# Patient Record
Sex: Male | Born: 1945 | Race: Black or African American | Hispanic: No | Marital: Married | Smoking: Former smoker
Health system: Southern US, Community
[De-identification: ages and names within clinical notes are randomized; demographics above are authoritative.]

## PROBLEM LIST (undated history)

## (undated) DIAGNOSIS — I350 Nonrheumatic aortic (valve) stenosis: Secondary | ICD-10-CM

## (undated) DIAGNOSIS — N400 Enlarged prostate without lower urinary tract symptoms: Secondary | ICD-10-CM

## (undated) DIAGNOSIS — I708 Atherosclerosis of other arteries: Secondary | ICD-10-CM

## (undated) DIAGNOSIS — K4020 Bilateral inguinal hernia, without obstruction or gangrene, not specified as recurrent: Secondary | ICD-10-CM

## (undated) DIAGNOSIS — E785 Hyperlipidemia, unspecified: Secondary | ICD-10-CM

## (undated) DIAGNOSIS — R011 Cardiac murmur, unspecified: Secondary | ICD-10-CM

## (undated) DIAGNOSIS — I10 Essential (primary) hypertension: Secondary | ICD-10-CM

## (undated) DIAGNOSIS — F1290 Cannabis use, unspecified, uncomplicated: Secondary | ICD-10-CM

## (undated) HISTORY — PX: COLONOSCOPY: SHX174

---

## 2014-03-16 ENCOUNTER — Ambulatory Visit: Payer: Self-pay | Admitting: Gastroenterology

## 2015-12-19 ENCOUNTER — Ambulatory Visit
Admission: EM | Admit: 2015-12-19 | Discharge: 2015-12-19 | Disposition: A | Discharge status: Home / Self Care | Payer: Medicare (Managed Care) | Attending: Family Medicine | Admitting: Family Medicine

## 2015-12-19 ENCOUNTER — Ambulatory Visit (INDEPENDENT_AMBULATORY_CARE_PROVIDER_SITE_OTHER): Payer: Medicare (Managed Care)

## 2015-12-19 DIAGNOSIS — M7711 Lateral epicondylitis, right elbow: Secondary | ICD-10-CM

## 2015-12-19 DIAGNOSIS — M25521 Pain in right elbow: Secondary | ICD-10-CM | POA: Diagnosis not present

## 2015-12-19 HISTORY — DX: Essential (primary) hypertension: I10

## 2015-12-19 MED ORDER — MELOXICAM 15 MG PO TABS: 15.0000 mg | ORAL_TABLET | Freq: Every day | ORAL | Status: DC

## 2015-12-19 NOTE — Discharge Instructions (Signed)
Recommend getting a CHO at the pharmacy or medical supply place of your choice. Make sure that show strep or a wrist band is tight enough to prevent movement of the elbow tendons over the lateral epicondyles bone. Tennis Elbow Tennis elbow is puffiness (inflammation) of the outer tendons of your forearm close to your elbow. Your tendons attach your muscles to your bones. Tennis elbow can happen in any sport or job in which you use your elbow too much. It is caused by doing the same motion over and over. Tennis elbow can cause:  Pain and tenderness in your forearm and the outer part of your elbow.  A burning feeling. This runs from your elbow through your arm.  Weak grip in your hands. HOME CARE Activity  Rest your elbow and wrist as told by your doctor. Try to avoid any activities that caused the problem until your doctor says that you can do them again.  If a physical therapist teaches you exercises, do all of them as told.  If you lift an object, lift it with your palm facing up. This is easier on your elbow. Lifestyle  If your tennis elbow is caused by sports, check your equipment and make sure that:  You are using it correctly.  It fits you well.  If your tennis elbow is caused by work, take breaks often, if you are able. Talk with your manager about doing your work in a way that is safe for you.  If your tennis elbow is caused by computer use, talk with your manager about any changes that can be made to your work setup. General Instructions  If told, apply ice to the painful area:  Put ice in a plastic bag.  Place a towel between your skin and the bag.  Leave the ice on for 20 minutes, 2-3 times per day.  Take medicines only as told by your doctor.  If you were given a brace, wear it as told by your doctor.  Keep all follow-up visits as told by your doctor. This is important. GET HELP IF:  Your pain does not get better with treatment.  Your pain gets worse.  You  have weakness in your forearm, hand, or fingers.  You cannot feel your forearm, hand, or fingers.   This information is not intended to replace advice given to you by your health care provider. Make sure you discuss any questions you have with your health care provider.   Document Released: 05/17/2010 Document Revised: 04/13/2015 Document Reviewed: 11/23/2014 Elsevier Interactive Patient Education 2016 [employer]  Tendinitis  Tendinitis is redness, soreness, and puffiness (inflammation) of the tendons. Tendons are band-like tissues that connect muscle to bone. Tendinitis often happens in the shoulders, heels, or elbows. It might happen if your job involves doing the same motions over and over. HOME CARE  Use a sling or splint as told by your doctor.  Put ice on the injured area.  Put ice in a plastic bag.  Place a towel between your skin and the bag.  Leave the ice on for 15-20 minutes, 03-04 times a day.  Avoid using your injured arm or leg until the pain goes away.  Do gentle exercises only as told by your doctor. Stop exercises if the pain gets worse, unless your doctor tells you otherwise.  Only take medicines as told by your doctor. GET HELP RIGHT AWAY IF:  Your pain and puffiness get worse.  You have new problems, such as loss  of feeling (numbness) in the hands. MAKE SURE YOU:  Understand these instructions.  Will watch your condition.  Will get help right away if you are not doing well or get worse.   This information is not intended to replace advice given to you by your health care provider. Make sure you discuss any questions you have with your health care provider.   Document Released: 03/09/2011 Document Revised: 02/19/2012 Document Reviewed: 03/09/2011 Elsevier Interactive Patient Education 2016 [employer]  Tendinitis  Tendinitis is redness, soreness, and puffiness (inflammation) of the tendons. Tendons are band-like tissues that connect muscle to  bone. Tendinitis often happens in the shoulders, heels, or elbows. It might happen if your job involves doing the same motions over and over. HOME CARE  Use a sling or splint as told by your doctor.  Put ice on the injured area.  Put ice in a plastic bag.  Place a towel between your skin and the bag.  Leave the ice on for 15-20 minutes, 03-04 times a day.  Avoid using your injured arm or leg until the pain goes away.  Do gentle exercises only as told by your doctor. Stop exercises if the pain gets worse, unless your doctor tells you otherwise.  Only take medicines as told by your doctor. GET HELP RIGHT AWAY IF:  Your pain and puffiness get worse.  You have new problems, such as loss of feeling (numbness) in the hands. MAKE SURE YOU:  Understand these instructions.  Will watch your condition.  Will get help right away if you are not doing well or get worse.   This information is not intended to replace advice given to you by your health care provider. Make sure you discuss any questions you have with your health care provider.   Document Released: 03/09/2011 Document Revised: 02/19/2012 Document Reviewed: 03/09/2011 Elsevier Interactive Patient Education 2016 [employer]  Lateral Epicondylitis With Rehab Lateral epicondylitis involves inflammation and pain around the outer portion of the elbow. The pain is caused by inflammation of the tendons in the forearm that bring back (extend) the wrist. Lateral epicondylitis is also called tennis elbow, because it is very common in tennis players. However, it may occur in any individual who extends the wrist repetitively. If lateral epicondylitis is left untreated, it may become a chronic problem. SYMPTOMS   Pain, tenderness, and inflammation on the outer (lateral) side of the elbow.  Pain or weakness with gripping activities.  Pain that increases with wrist-twisting motions (playing tennis, using a screwdriver, opening a door or  a jar).  Pain with lifting objects, including a coffee cup. CAUSES  Lateral epicondylitis is caused by inflammation of the tendons that extend the wrist. Causes of injury may include:  Repetitive stress and strain on the muscles and tendons that extend the wrist.  Sudden change in activity level or intensity.  Incorrect grip in racquet sports.  Incorrect grip size of racquet (often too large).  Incorrect hitting position or technique (usually backhand, leading with the elbow).  Using a racket that is too heavy. RISK INCREASES WITH:  Sports or occupations that require repetitive and/or strenuous forearm and wrist movements (tennis, squash, racquetball, carpentry).  Poor wrist and forearm strength and flexibility.  Failure to warm up properly before activity.  Resuming activity before healing, rehabilitation, and conditioning are complete. PREVENTION   Warm up and stretch properly before activity.  Maintain physical fitness:  Strength, flexibility, and endurance.  Cardiovascular fitness.  Wear and use properly fitted  equipment.  Learn and use proper technique and have a coach correct improper technique.  Wear a tennis elbow (counterforce) brace. PROGNOSIS  The course of this condition depends on the degree of the injury. If treated properly, acute cases (symptoms lasting less than 4 weeks) are often resolved in 2 to 6 weeks. Chronic (longer lasting cases) often resolve in 3 to 6 months but may require physical therapy. RELATED COMPLICATIONS   Frequently recurring symptoms, resulting in a chronic problem. Properly treating the problem the first time decreases frequency of recurrence.  Chronic inflammation, scarring tendon degeneration, and partial tendon tear, requiring surgery.  Delayed healing or resolution of symptoms. TREATMENT  Treatment first involves the use of ice and medicine to reduce pain and inflammation. Strengthening and stretching exercises may help  reduce discomfort if performed regularly. These exercises may be performed at home if the condition is an acute injury. Chronic cases may require a referral to a physical therapist for evaluation and treatment. Your caregiver may advise a corticosteroid injection to help reduce inflammation. Rarely, surgery is needed. MEDICATION  If pain medicine is needed, nonsteroidal anti-inflammatory medicines (aspirin and ibuprofen), or other minor pain relievers (acetaminophen), are often advised.  Do not take pain medicine for 7 days before surgery.  Prescription pain relievers may be given, if your caregiver thinks they are needed. Use only as directed and only as much as you need.  Corticosteroid injections may be recommended. These injections should be reserved only for the most severe cases, because they can only be given a certain number of times. HEAT AND COLD  Cold treatment (icing) should be applied for 10 to 15 minutes every 2 to 3 hours for inflammation and pain, and immediately after activity that aggravates your symptoms. Use ice packs or an ice massage.  Heat treatment may be used before performing stretching and strengthening activities prescribed by your caregiver, physical therapist, or athletic trainer. Use a heat pack or a warm water soak. SEEK MEDICAL CARE IF: Symptoms get worse or do not improve in 2 weeks, despite treatment. EXERCISES  RANGE OF MOTION (ROM) AND STRETCHING EXERCISES - Epicondylitis, Lateral (Tennis Elbow) These exercises may help you when beginning to rehabilitate your injury. Your symptoms may go away with or without further involvement from your physician, physical therapist, or athletic trainer. While completing these exercises, remember:   Restoring tissue flexibility helps normal motion to return to the joints. This allows healthier, less painful movement and activity.  An effective stretch should be held for at least 30 seconds.  A stretch should never be  painful. You should only feel a gentle lengthening or release in the stretched tissue. RANGE OF MOTION - Wrist Flexion, Active-Assisted  Extend your right / left elbow with your fingers pointing down.*  Gently pull the back of your hand towards you, until you feel a gentle stretch on the top of your forearm.  Hold this position for __________ seconds. Repeat __________ times. Complete this exercise __________ times per day.  *If directed by your physician, physical therapist or athletic trainer, complete this stretch with your elbow bent, rather than extended. RANGE OF MOTION - Wrist Extension, Active-Assisted  Extend your right / left elbow and turn your palm upwards.*  Gently pull your palm and fingertips back, so your wrist extends and your fingers point more toward the ground.  You should feel a gentle stretch on the inside of your forearm.  Hold this position for __________ seconds. Repeat __________ times. Complete this exercise  __________ times per day. *If directed by your physician, physical therapist or athletic trainer, complete this stretch with your elbow bent, rather than extended. STRETCH - Wrist Flexion  Place the back of your right / left hand on a tabletop, leaving your elbow slightly bent. Your fingers should point away from your body.  Gently press the back of your hand down onto the table by straightening your elbow. You should feel a stretch on the top of your forearm.  Hold this position for __________ seconds. Repeat __________ times. Complete this stretch __________ times per day.  STRETCH - Wrist Extension   Place your right / left fingertips on a tabletop, leaving your elbow slightly bent. Your fingers should point backwards.  Gently press your fingers and palm down onto the table by straightening your elbow. You should feel a stretch on the inside of your forearm.  Hold this position for __________ seconds. Repeat __________ times. Complete this stretch  __________ times per day.  STRENGTHENING EXERCISES - Epicondylitis, Lateral (Tennis Elbow) These exercises may help you when beginning to rehabilitate your injury. They may resolve your symptoms with or without further involvement from your physician, physical therapist, or athletic trainer. While completing these exercises, remember:   Muscles can gain both the endurance and the strength needed for everyday activities through controlled exercises.  Complete these exercises as instructed by your physician, physical therapist or athletic trainer. Increase the resistance and repetitions only as guided.  You may experience muscle soreness or fatigue, but the pain or discomfort you are trying to eliminate should never worsen during these exercises. If this pain does get worse, stop and make sure you are following the directions exactly. If the pain is still present after adjustments, discontinue the exercise until you can discuss the trouble with your caregiver. STRENGTH - Wrist Flexors  Sit with your right / left forearm palm-up and fully supported on a table or countertop. Your elbow should be resting below the height of your shoulder. Allow your wrist to extend over the edge of the surface.  Loosely holding a __________ weight, or a piece of rubber exercise band or tubing, slowly curl your hand up toward your forearm.  Hold this position for __________ seconds. Slowly lower the wrist back to the starting position in a controlled manner. Repeat __________ times. Complete this exercise __________ times per day.  STRENGTH - Wrist Extensors  Sit with your right / left forearm palm-down and fully supported on a table or countertop. Your elbow should be resting below the height of your shoulder. Allow your wrist to extend over the edge of the surface.  Loosely holding a __________ weight, or a piece of rubber exercise band or tubing, slowly curl your hand up toward your forearm.  Hold this position  for __________ seconds. Slowly lower the wrist back to the starting position in a controlled manner. Repeat __________ times. Complete this exercise __________ times per day.  STRENGTH - Ulnar Deviators  Stand with a ____________________ weight in your right / left hand, or sit while holding a rubber exercise band or tubing, with your healthy arm supported on a table or countertop.  Move your wrist, so that your pinkie travels toward your forearm and your thumb moves away from your forearm.  Hold this position for __________ seconds and then slowly lower the wrist back to the starting position. Repeat __________ times. Complete this exercise __________ times per day STRENGTH - Radial Deviators  Stand with a ____________________ weight in  your right / left hand, or sit while holding a rubber exercise band or tubing, with your injured arm supported on a table or countertop.  Raise your hand upward in front of you or pull up on the rubber tubing.  Hold this position for __________ seconds and then slowly lower the wrist back to the starting position. Repeat __________ times. Complete this exercise __________ times per day. STRENGTH - Forearm Supinators   Sit with your right / left forearm supported on a table, keeping your elbow below shoulder height. Rest your hand over the edge, palm down.  Gently grip a hammer or a soup ladle.  Without moving your elbow, slowly turn your palm and hand upward to a "thumbs-up" position.  Hold this position for __________ seconds. Slowly return to the starting position. Repeat __________ times. Complete this exercise __________ times per day.  STRENGTH - Forearm Pronators   Sit with your right / left forearm supported on a table, keeping your elbow below shoulder height. Rest your hand over the edge, palm up.  Gently grip a hammer or a soup ladle.  Without moving your elbow, slowly turn your palm and hand upward to a "thumbs-up" position.  Hold this  position for __________ seconds. Slowly return to the starting position. Repeat __________ times. Complete this exercise __________ times per day.  STRENGTH - Grip  Grasp a tennis ball, a dense sponge, or a large, rolled sock in your hand.  Squeeze as hard as you can, without increasing any pain.  Hold this position for __________ seconds. Release your grip slowly. Repeat __________ times. Complete this exercise __________ times per day.  STRENGTH - Elbow Extensors, Isometric  Stand or sit upright, on a firm surface. Place your right / left arm so that your palm faces your stomach, and it is at the height of your waist.  Place your opposite hand on the underside of your forearm. Gently push up as your right / left arm resists. Push as hard as you can with both arms, without causing any pain or movement at your right / left elbow. Hold this stationary position for __________ seconds. Gradually release the tension in both arms. Allow your muscles to relax completely before repeating.   This information is not intended to replace advice given to you by your health care provider. Make sure you discuss any questions you have with your health care provider.   Document Released: 11/27/2005 Document Revised: 12/18/2014 Document Reviewed: 03/11/2009 Elsevier Interactive Patient Education [employer]

## 2015-12-19 NOTE — ED Provider Notes (Addendum)
CSN: [id number]     Arrival date & time 12/19/15  1210 History   First MD Initiated Contact with Patient 12/19/15 1259     Nurses notes were reviewed. Chief Complaint  Patient presents with  . Arm Pain    Right elbow and right lower arm pain x one month. Elbow somewhat tender to touch and burning sensation. Lower arm feels strained. 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 Hurts worse after shoveling snow yesterday. Pain 5/10   Patient reports pain in his right arm about 3 months before October and Halloween. Patient reports he was fine until sometime in October when he started noticing pain in his right elbow. He states the pain has progressively gotten worse. He also noticed that he was out shoveling yesterday in the snow and he has started to have pain in the left elbow as well both of the areas of pain is on the lateral aspect of the elbow at the elbow joint. 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 When he is actually playing the instrument he does not have too much discomfort after playing bass when the throbbing and pain starts. He has mild hypertension states he does not smoke and does not have any history of diabetes.      (Consider location/radiation/quality/duration/timing/severity/associated sxs/prior Treatment) Patient is a 70 y.o. male presenting with arm pain. No language interpreter was used.  Arm Pain This is a new problem. The current episode started more than 1 week ago (About 3 months duration). The problem occurs constantly. The problem has been gradually worsening. Pertinent negatives include no chest pain, no abdominal pain and no headaches. The symptoms are aggravated by exertion. Nothing relieves the symptoms. He has tried nothing for the symptoms.    Past Medical History  Diagnosis Date  . HTN (hypertension)    No past surgical history on file. No family history on file. Social History    Substance Use Topics  . Smoking status: Never Smoker   . Smokeless tobacco: Not on file  . Alcohol Use: Yes     Comment: rare    Review of Systems  Cardiovascular: Negative for chest pain.  Gastrointestinal: Negative for abdominal pain.  Neurological: Negative for headaches.  All other systems reviewed and are negative.   Allergies  Review of patient's allergies indicates no known allergies.  Home Medications   Prior to Admission medications   Medication Sig Start Date End Date Taking? Authorizing Provider  amLODipine (NORVASC) 10 MG tablet Take 10 mg by mouth daily.   Yes Historical Provider, MD   Meds Ordered and Administered this Visit  Medications - No data to display  BP 136/74 mmHg  Pulse 61  Temp(Src) 97.9 F (36.6 C) (Oral)  Resp 18  Ht 5\' 7"  (1.702 m)  Wt 173 lb (78.472 kg)  BMI 27.09 kg/m2  SpO2 100% No data found.   Physical Exam  Constitutional: He appears well-developed and well-nourished.  HENT:  Head: Normocephalic and atraumatic.  Eyes: Conjunctivae are normal. Pupils are equal, round, and reactive to light.  Musculoskeletal: He exhibits tenderness.       Left forearm: He exhibits tenderness and swelling.       Arms: Neurological: He is alert.  Skin: Skin is warm and dry.  Psychiatric: He has a normal mood and affect.  Vitals reviewed.   ED Course  Procedures (including critical care time)  Labs Review Labs  Reviewed - No data to display  Imaging Review Dg Elbow Complete Right  12/19/2015  CLINICAL DATA:  Posterior right elbow pain for many years. No history of trauma. EXAM: RIGHT ELBOW - COMPLETE 3+ VIEW COMPARISON:  None. FINDINGS: There is no evidence of fracture, dislocation, or joint effusion. There is no evidence of arthropathy or other focal bone abnormality. Soft tissues are unremarkable. IMPRESSION: Negative. Electronically Signed   By: Bary Richard M.D.   On: 12/19/2015 13:49     Visual Acuity Review  Right Eye Distance:    Left Eye Distance:   Bilateral Distance:    Right Eye Near:   Left Eye Near:    Bilateral Near:         MDM   1. Right tennis elbow     Patient has right tennis elbow and is starting to develop a left tennis elbow as well. We'll recommend show strap or tight wrist pain to keep the tendon from moving over the lateral epicondyles which is causing the irritation and pain. We'll place on Mobic 15 mg 1 tablet a day. He declined work note for tomorrow.    Hassan Rowan, MD 12/19/15 [id number]  Hassan Rowan, MD 12/19/15 [phone]

## 2016-02-01 ENCOUNTER — Ambulatory Visit
Admission: EM | Admit: 2016-02-01 | Discharge: 2016-02-01 | Disposition: A | Discharge status: Home / Self Care | Payer: Medicare (Managed Care) | Attending: Family Medicine | Admitting: Family Medicine

## 2016-02-01 DIAGNOSIS — M7712 Lateral epicondylitis, left elbow: Secondary | ICD-10-CM

## 2016-02-01 DIAGNOSIS — M7711 Lateral epicondylitis, right elbow: Secondary | ICD-10-CM

## 2016-02-01 MED ORDER — MELOXICAM 15 MG PO TABS: 15.0000 mg | ORAL_TABLET | Freq: Every day | ORAL | Status: DC

## 2016-02-01 NOTE — ED Provider Notes (Signed)
CSN: [id number]     Arrival date & time 02/01/16  4098 History   First MD Initiated Contact with Patient 02/01/16 1219     Chief Complaint  Patient presents with  . Elbow Pain    Right Elbow  . Nasal Congestion   (Consider location/radiation/quality/duration/timing/severity/associated sxs/prior Treatment) HPI   70 year old gentleman who returns today after being seen and evaluated by Dr. Thurmond Butts on 12/19/2015 for bilateral tennis elbows. The patient states that he has improved but has noticed that he has pain at nighttime after being active. 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 He states it does not bother him during the activity but that nighttime especially painful. Meloxicam has definitely helped him. He also had been using a tennis elbow strap but had been positioning it in the wrong position. Is also been having some nasal congestion recently said no fever or chills.  Past Medical History  Diagnosis Date  . HTN (hypertension)    History reviewed. No pertinent past surgical history. History reviewed. No pertinent family history. Social History  Substance Use Topics  . Smoking status: Never Smoker   . Smokeless tobacco: None  . Alcohol Use: Yes     Comment: rare    Review of Systems  Constitutional: Positive for activity change. Negative for fever, chills and fatigue.  Musculoskeletal: Positive for myalgias.  All other systems reviewed and are negative.   Allergies  Review of patient's allergies indicates no known allergies.  Home Medications   Prior to Admission medications   Medication Sig Start Date End Date Taking? Authorizing Provider  amLODipine (NORVASC) 10 MG tablet Take 10 mg by mouth daily.   Yes Historical Provider, MD  meloxicam (MOBIC) 15 MG tablet Take 1 tablet (15 mg total) by mouth daily. 02/01/16   Lutricia Feil, PA-C   Meds Ordered and Administered this Visit  Medications - No data to display  BP 193/91  mmHg  Pulse 67  Temp(Src) 98.2 F (36.8 C) (Oral)  Resp 16  Ht  (1.702 m)  Wt 173 lb (78.472 kg)  BMI 27.09 kg/m2  SpO2 100% No data found.   Physical Exam  Constitutional: He is oriented to person, place, and time. He appears well-developed and well-nourished. No distress.  HENT:  Head: Normocephalic and atraumatic.  Eyes: Conjunctivae are normal. Pupils are equal, round, and reactive to light.  Neck: Normal range of motion. Neck supple.  Musculoskeletal: He exhibits tenderness. He exhibits no edema.  Examination of both elbows shows a full range of motion of flexion and extension pronation and supination. There is minimal pain over the common extensor origin more prevalent on the right than on the left. He is able to lift with his wrist in pronation without elbow pain.  Neurological: He is alert and oriented to person, place, and time.  Skin: Skin is warm and dry. He is not diaphoretic.  Psychiatric: He has a normal mood and affect. His behavior is normal. Judgment and thought content normal.  Nursing note and vitals reviewed.   ED Course  Procedures (including critical care time)  Labs Review Labs Reviewed - No data to display  Imaging Review No results found.   Visual Acuity Review  Right Eye Distance:   Left Eye Distance:   Bilateral Distance:    Right Eye Near:   Left Eye Near:    Bilateral Near:         MDM   1. Bilateral  tennis elbow    Discharge Medication List as of 02/01/2016 12:31 PM     Plan: 1. Diagnosis . reviewed with patient 2. rx as per orders; risks, benefits, potential side effects reviewed with patient 3. Recommend supportive treatment with proper use of the tennis elbow strap . I have cautioned him guarding the use of meloxicam particular at nighttime before going to bed. Recommended that he used with meals only. He also used only as necessary. For the nasal congestion I recommended Flonase which she can get as an OTC drug. He may  want to see tennis strap during his performances as this may be causing him his nighttime discomfort. 4. F/u prn if symptoms worsen or don't improve    Lutricia Feil, PA-C 02/01/16 1244

## 2016-02-01 NOTE — Discharge Instructions (Signed)
Tennis Elbow Tennis elbow (lateral epicondylitis) is inflammation of the outer tendons of your forearm close to your elbow. Your tendons attach your muscles to your bones. The outer tendons of your forearm are used to extend your wrist, and they attach on the outside part of your elbow. Tennis elbow is often found in people who play tennis, but anyone may get the condition from repeatedly extending the wrist or turning the forearm. CAUSES This condition is caused by repeatedly extending your wrist and using your hands. It can result from sports or work that requires repetitive forearm movements. Tennis elbow may also be caused by an injury. RISK FACTORS You have a higher risk of developing tennis elbow if you play tennis or another racquet sport. You also have a higher risk if you frequently use your hands for work. This condition is also more likely to develop in:  Musicians.  Carpenters, painters, and plumbers.  Cooks.  Cashiers.  People who work in factories.  Construction workers.  Butchers.  People who use computers. SYMPTOMS Symptoms of this condition include:  Pain and tenderness in your forearm and the outer part of your elbow. You may only feel the pain when you use your arm, or you may feel it even when you are not using your arm.  A burning feeling that runs from your elbow through your arm.  Weak grip in your hands. DIAGNOSIS  This condition may be diagnosed by medical history and physical exam. You may also have other tests, including:  X-rays.  MRI. TREATMENT Your health care provider will recommend lifestyle adjustments, such as resting and icing your arm. Treatment may also include:  Medicines for inflammation. This may include shots of cortisone if your pain continues.  Physical therapy. This may include massage or exercises.  An elbow brace. Surgery may eventually be recommended if your pain does not go away with treatment. HOME CARE  INSTRUCTIONS Activity  Rest your elbow and wrist as directed by your health care provider. Try to avoid any activities that caused the problem until your health care provider says that you can do them again.  If a physical therapist teaches you exercises, do all of them as directed.  If you lift an object, lift it with your palm facing upward. This lowers the stress on your elbow. Lifestyle  If your tennis elbow is caused by sports, check your equipment and make sure that:  You are using it correctly.  It is the best fit for you.  If your tennis elbow is caused by work, take breaks frequently, if you are able. Talk with your manager about how to best perform tasks in a way that is safe.  If your tennis elbow is caused by computer use, talk with your manager about any changes that can be made to your work environment. General Instructions  If directed, apply ice to the painful area:  Put ice in a plastic bag.  Place a towel between your skin and the bag.  Leave the ice on for 20 minutes, 2-3 times per day.  Take medicines only as directed by your health care provider.  If you were given a brace, wear it as directed by your health care provider.  Keep all follow-up visits as directed by your health care provider. This is important. SEEK MEDICAL CARE IF:  Your pain does not get better with treatment.  Your pain gets worse.  You have numbness or weakness in your forearm, hand, or fingers.     This information is not intended to replace advice given to you by your health care provider. Make sure you discuss any questions you have with your health care provider.   Document Released: 11/27/2005 Document Revised: 04/13/2015 Document Reviewed: 11/23/2014 Elsevier Interactive Patient Education 2016 [employer]  

## 2016-02-01 NOTE — ED Notes (Signed)
Patient c/o nasal congestion which started this past Friday.  Also, patient c/o right elbow pain, which he saw Dr. Thurmond Butts on 12/19/2015 for.  Patient states that he would like a refill of his Meloxicam medication that Dr. Thurmond Butts prescribed during his last visit.

## 2016-04-18 DIAGNOSIS — I10 Essential (primary) hypertension: Secondary | ICD-10-CM | POA: Diagnosis not present

## 2016-04-18 DIAGNOSIS — M7711 Lateral epicondylitis, right elbow: Secondary | ICD-10-CM | POA: Diagnosis not present

## 2016-04-18 DIAGNOSIS — Z125 Encounter for screening for malignant neoplasm of prostate: Secondary | ICD-10-CM | POA: Diagnosis not present

## 2016-07-19 DIAGNOSIS — E7800 Pure hypercholesterolemia, unspecified: Secondary | ICD-10-CM | POA: Diagnosis not present

## 2016-07-19 DIAGNOSIS — I10 Essential (primary) hypertension: Secondary | ICD-10-CM | POA: Diagnosis not present

## 2016-10-19 DIAGNOSIS — I10 Essential (primary) hypertension: Secondary | ICD-10-CM | POA: Diagnosis not present

## 2016-10-19 DIAGNOSIS — E7800 Pure hypercholesterolemia, unspecified: Secondary | ICD-10-CM | POA: Diagnosis not present

## 2017-06-06 DIAGNOSIS — K4090 Unilateral inguinal hernia, without obstruction or gangrene, not specified as recurrent: Secondary | ICD-10-CM | POA: Diagnosis not present

## 2017-06-06 DIAGNOSIS — Z125 Encounter for screening for malignant neoplasm of prostate: Secondary | ICD-10-CM | POA: Diagnosis not present

## 2017-06-06 DIAGNOSIS — I10 Essential (primary) hypertension: Secondary | ICD-10-CM | POA: Diagnosis not present

## 2017-08-01 DIAGNOSIS — K4090 Unilateral inguinal hernia, without obstruction or gangrene, not specified as recurrent: Secondary | ICD-10-CM | POA: Diagnosis not present

## 2017-08-01 DIAGNOSIS — N400 Enlarged prostate without lower urinary tract symptoms: Secondary | ICD-10-CM | POA: Diagnosis not present

## 2017-08-10 ENCOUNTER — Encounter
Admission: RE | Admit: 2017-08-10 | Discharge: 2017-08-10 | Disposition: A | Discharge status: Home / Self Care | Payer: PPO | Source: Ambulatory Visit | Attending: Surgery | Admitting: Surgery

## 2017-08-10 DIAGNOSIS — I10 Essential (primary) hypertension: Secondary | ICD-10-CM | POA: Insufficient documentation

## 2017-08-10 DIAGNOSIS — K4090 Unilateral inguinal hernia, without obstruction or gangrene, not specified as recurrent: Secondary | ICD-10-CM | POA: Insufficient documentation

## 2017-08-10 DIAGNOSIS — R001 Bradycardia, unspecified: Secondary | ICD-10-CM | POA: Diagnosis not present

## 2017-08-10 NOTE — Patient Instructions (Signed)
  Your procedure is scheduled on: Tues. 08/21/17 Report to Day Surgery. To find out your arrival time please call [phone] between 1PM - 3PM on Mon 08/20/17.  Remember: Instructions that are not followed completely may result in serious medical risk, up to and including death, or upon the discretion of your surgeon and anesthesiologist your surgery may need to be rescheduled.    __x__ 1. Do not eat food after midnight the night before your procedure. No gum chewing or hard candies. You may drink clear liquids up to 2 hours before you are scheduled to arrive for your surgery- DO not drink clear liquids within 2 hours of the start of your surgery.  Clear Liquids include: water, apple juice without pulp, clear carbohydrate drink such as Clearfast of Gartorade, Black Coffee or Tea (Do not add anything to coffee or tea).    __x__ 2. No Alcohol for 24 hours before or after surgery.   ____ 3. Do Not Smoke For 24 Hours Prior to Your Surgery.   ____ 4. Bring all medications with you on the day of surgery if instructed.    _x___ 5. Notify your doctor if there is any change in your medical condition     (cold, fever, infections).       Do not wear jewelry, make-up, hairpins, clips or nail polish.  Do not wear lotions, powders, or perfumes. You may wear deodorant.  Do not shave 48 hours prior to surgery. Men may shave face and neck.  Do not bring valuables to the hospital.    [facility] is not responsible for any belongings or valuables.               Contacts, dentures or bridgework may not be worn into surgery.  Leave your suitcase in the car. After surgery it may be brought to your room.  For patients admitted to the hospital, discharge time is determined by your                treatment team.   Patients discharged the day of surgery will not be allowed to drive home.   Please read over the following fact sheets that you were given:      __x__ Take these medicines the  morning of surgery with A SIP OF WATER:    1. amLODipine (NORVASC) 10 MG tablet  2.   3.   4.  5.  6.  ____ Fleet Enema (as directed)   __x__ Use CHG Soap as directed  ____ Use inhalers on the day of surgery  ____ Stop metformin 2 days prior to surgery    ____ Take 1/2 of usual insulin dose the night before surgery and none on the morning of surgery.   ____ Stop Coumadin/Plavix/aspirin on   ____ Stop Anti-inflammatories on    ____ Stop supplements until after surgery.    ____ Bring C-Pap to the hospital.

## 2017-08-14 NOTE — Pre-Procedure Instructions (Signed)
EKG NOTED. 

## 2017-08-20 MED ORDER — CEFAZOLIN SODIUM-DEXTROSE 2-4 GM/100ML-% IV SOLN
2.0000 g | Freq: Once | INTRAVENOUS | Status: AC
  Administered 2017-08-21: 2 g via INTRAVENOUS

## 2017-08-21 ENCOUNTER — Ambulatory Visit
Admission: RE | Admit: 2017-08-21 | Discharge: 2017-08-21 | Disposition: A | Discharge status: Home / Self Care | Payer: PPO | Source: Ambulatory Visit | Attending: Surgery | Admitting: Surgery

## 2017-08-21 ENCOUNTER — Ambulatory Visit: Payer: PPO | Admitting: Anesthesiology

## 2017-08-21 ENCOUNTER — Encounter
Admission: RE | Disposition: A | Discharge status: Home / Self Care | Payer: Self-pay | Source: Ambulatory Visit | Attending: Surgery

## 2017-08-21 DIAGNOSIS — I10 Essential (primary) hypertension: Secondary | ICD-10-CM | POA: Diagnosis not present

## 2017-08-21 DIAGNOSIS — Z79899 Other long term (current) drug therapy: Secondary | ICD-10-CM | POA: Insufficient documentation

## 2017-08-21 DIAGNOSIS — Z87891 Personal history of nicotine dependence: Secondary | ICD-10-CM | POA: Insufficient documentation

## 2017-08-21 DIAGNOSIS — Z7982 Long term (current) use of aspirin: Secondary | ICD-10-CM | POA: Diagnosis not present

## 2017-08-21 DIAGNOSIS — K4090 Unilateral inguinal hernia, without obstruction or gangrene, not specified as recurrent: Secondary | ICD-10-CM | POA: Insufficient documentation

## 2017-08-21 HISTORY — PX: INGUINAL HERNIA REPAIR: SHX194

## 2017-08-21 LAB — URINE DRUG SCREEN, QUALITATIVE (ARMC ONLY)
Amphetamines, Ur Screen: NOT DETECTED
BARBITURATES, UR SCREEN: NOT DETECTED
BENZODIAZEPINE, UR SCRN: NOT DETECTED
CANNABINOID 50 NG, UR ~~LOC~~: POSITIVE — AB
COCAINE METABOLITE, UR ~~LOC~~: NOT DETECTED
MDMA (Ecstasy)Ur Screen: NOT DETECTED
Methadone Scn, Ur: NOT DETECTED
Opiate, Ur Screen: NOT DETECTED
Phencyclidine (PCP) Ur S: NOT DETECTED
TRICYCLIC, UR SCREEN: NOT DETECTED

## 2017-08-21 SURGERY — REPAIR, HERNIA, INGUINAL, ADULT
Anesthesia: General | Laterality: Right | Wound class: Clean

## 2017-08-21 MED ORDER — OXYCODONE HCL 5 MG/5ML PO SOLN: 5.0000 mg | Freq: Once | ORAL | Status: DC | PRN

## 2017-08-21 MED ORDER — PHENYLEPHRINE HCL 10 MG/ML IJ SOLN
INTRAMUSCULAR | Status: DC | PRN
  Administered 2017-08-21 (×4): 100 ug via INTRAVENOUS

## 2017-08-21 MED ORDER — FENTANYL CITRATE (PF) 100 MCG/2ML IJ SOLN
25.0000 ug | INTRAMUSCULAR | Status: DC | PRN
  Administered 2017-08-21: 50 ug via INTRAVENOUS

## 2017-08-21 MED ORDER — BUPIVACAINE-EPINEPHRINE (PF) 0.5% -1:200000 IJ SOLN
INTRAMUSCULAR | Status: AC
  Filled 2017-08-21: qty 30

## 2017-08-21 MED ORDER — PROPOFOL 10 MG/ML IV BOLUS
INTRAVENOUS | Status: DC | PRN
  Administered 2017-08-21: 150 mg via INTRAVENOUS

## 2017-08-21 MED ORDER — HYDROCODONE-ACETAMINOPHEN 5-325 MG PO TABS
1.0000 | ORAL_TABLET | ORAL | Status: DC | PRN
Start: 2017-08-21 — End: 2017-08-21

## 2017-08-21 MED ORDER — HYDROCODONE-ACETAMINOPHEN 5-325 MG PO TABS: 1.0000 | ORAL_TABLET | ORAL | 0 refills | Status: DC | PRN

## 2017-08-21 MED ORDER — PROPOFOL 10 MG/ML IV BOLUS
INTRAVENOUS | Status: AC
  Filled 2017-08-21: qty 40

## 2017-08-21 MED ORDER — LIDOCAINE HCL (CARDIAC) 20 MG/ML IV SOLN
INTRAVENOUS | Status: DC | PRN
  Administered 2017-08-21: 60 mg via INTRAVENOUS

## 2017-08-21 MED ORDER — FENTANYL CITRATE (PF) 100 MCG/2ML IJ SOLN
INTRAMUSCULAR | Status: AC
  Administered 2017-08-21: 50 ug via INTRAVENOUS
  Filled 2017-08-21: qty 2

## 2017-08-21 MED ORDER — SUGAMMADEX SODIUM 200 MG/2ML IV SOLN
INTRAVENOUS | Status: DC | PRN
  Administered 2017-08-21: 200 mg via INTRAVENOUS

## 2017-08-21 MED ORDER — FENTANYL CITRATE (PF) 100 MCG/2ML IJ SOLN
INTRAMUSCULAR | Status: AC
  Filled 2017-08-21: qty 2

## 2017-08-21 MED ORDER — ACETAMINOPHEN 10 MG/ML IV SOLN
INTRAVENOUS | Status: DC | PRN
  Administered 2017-08-21: 1000 mg via INTRAVENOUS

## 2017-08-21 MED ORDER — FAMOTIDINE 20 MG PO TABS
ORAL_TABLET | ORAL | Status: AC
  Filled 2017-08-21: qty 1

## 2017-08-21 MED ORDER — OXYCODONE HCL 5 MG PO TABS: 5.0000 mg | ORAL_TABLET | Freq: Once | ORAL | Status: DC | PRN

## 2017-08-21 MED ORDER — CEFAZOLIN SODIUM-DEXTROSE 2-4 GM/100ML-% IV SOLN
INTRAVENOUS | Status: AC
  Filled 2017-08-21: qty 100

## 2017-08-21 MED ORDER — ONDANSETRON HCL 4 MG/2ML IJ SOLN
INTRAMUSCULAR | Status: DC | PRN
  Administered 2017-08-21: 4 mg via INTRAVENOUS

## 2017-08-21 MED ORDER — LACTATED RINGERS IV SOLN
INTRAVENOUS | Status: DC
  Administered 2017-08-21 (×2): via INTRAVENOUS

## 2017-08-21 MED ORDER — BUPIVACAINE-EPINEPHRINE (PF) 0.5% -1:200000 IJ SOLN
INTRAMUSCULAR | Status: DC | PRN
  Administered 2017-08-21: 14 mL via PERINEURAL

## 2017-08-21 MED ORDER — FENTANYL CITRATE (PF) 100 MCG/2ML IJ SOLN
INTRAMUSCULAR | Status: DC | PRN
  Administered 2017-08-21 (×2): 50 ug via INTRAVENOUS

## 2017-08-21 MED ORDER — SUCCINYLCHOLINE CHLORIDE 20 MG/ML IJ SOLN
INTRAMUSCULAR | Status: AC
  Filled 2017-08-21: qty 1

## 2017-08-21 MED ORDER — DEXAMETHASONE SODIUM PHOSPHATE 10 MG/ML IJ SOLN
INTRAMUSCULAR | Status: DC | PRN
  Administered 2017-08-21: 10 mg via INTRAVENOUS

## 2017-08-21 MED ORDER — FAMOTIDINE 20 MG PO TABS
20.0000 mg | ORAL_TABLET | Freq: Once | ORAL | Status: AC
  Administered 2017-08-21: 20 mg via ORAL

## 2017-08-21 MED ORDER — ROCURONIUM BROMIDE 100 MG/10ML IV SOLN
INTRAVENOUS | Status: DC | PRN
  Administered 2017-08-21: 30 mg via INTRAVENOUS
  Administered 2017-08-21: 20 mg via INTRAVENOUS
  Administered 2017-08-21: 10 mg via INTRAVENOUS

## 2017-08-21 MED ORDER — ACETAMINOPHEN 10 MG/ML IV SOLN
INTRAVENOUS | Status: AC
  Filled 2017-08-21: qty 100

## 2017-08-21 MED ORDER — SUGAMMADEX SODIUM 200 MG/2ML IV SOLN
INTRAVENOUS | Status: AC
  Filled 2017-08-21: qty 2

## 2017-08-21 SURGICAL SUPPLY — 26 items
BLADE SURG 15 STRL LF DISP TIS (BLADE) ×1 IMPLANT
BLADE SURG 15 STRL SS (BLADE) ×2
CANISTER SUCT 1200ML W/VALVE (MISCELLANEOUS) ×3 IMPLANT
CHLORAPREP W/TINT 26ML (MISCELLANEOUS) ×3 IMPLANT
DERMABOND ADVANCED (GAUZE/BANDAGES/DRESSINGS) ×2
DERMABOND ADVANCED .7 DNX12 (GAUZE/BANDAGES/DRESSINGS) ×1 IMPLANT
DRAIN PENROSE 5/8X18 LTX STRL (WOUND CARE) ×3 IMPLANT
DRAPE LAPAROTOMY 77X122 PED (DRAPES) ×3 IMPLANT
ELECT REM PT RETURN 9FT ADLT (ELECTROSURGICAL) ×3
ELECTRODE REM PT RTRN 9FT ADLT (ELECTROSURGICAL) ×1 IMPLANT
GLOVE BIO SURGEON STRL SZ7.5 (GLOVE) ×15 IMPLANT
GOWN STRL REUS W/ TWL LRG LVL3 (GOWN DISPOSABLE) ×3 IMPLANT
GOWN STRL REUS W/TWL LRG LVL3 (GOWN DISPOSABLE) ×6
KIT RM TURNOVER STRD PROC AR (KITS) ×3 IMPLANT
LABEL OR SOLS (LABEL) ×3 IMPLANT
MESH SYNTHETIC 4X6 SOFT BARD (Mesh General) ×1 IMPLANT
MESH SYNTHETIC SOFT BARD 4X6 (Mesh General) ×2 IMPLANT
NEEDLE HYPO 25X1 1.5 SAFETY (NEEDLE) ×3 IMPLANT
NS IRRIG 500ML POUR BTL (IV SOLUTION) ×3 IMPLANT
PACK BASIN MINOR ARMC (MISCELLANEOUS) ×3 IMPLANT
SUT CHROMIC 4 0 RB 1X27 (SUTURE) ×3 IMPLANT
SUT MNCRL AB 4-0 PS2 18 (SUTURE) ×3 IMPLANT
SUT SURGILON 0 30 BLK (SUTURE) ×6 IMPLANT
SUT VIC AB 4-0 SH 27 (SUTURE) ×2
SUT VIC AB 4-0 SH 27XANBCTRL (SUTURE) ×1 IMPLANT
SYRINGE 10CC LL (SYRINGE) ×3 IMPLANT

## 2017-08-21 NOTE — Anesthesia Procedure Notes (Signed)
Procedure Name: Intubation Date/Time: 08/21/2017 9:19 AM Performed by: Tamala Julian, Tyquan Carmickle Pre-anesthesia Checklist: Patient identified, Emergency Drugs available, Suction available and Patient being monitored Patient Re-evaluated:Patient Re-evaluated prior to induction Oxygen Delivery Method: Circle system utilized Preoxygenation: Pre-oxygenation with 100% oxygen Induction Type: IV induction Ventilation: Mask ventilation without difficulty Laryngoscope Size: Mac, McGraph and 4 (Used electively for teaching purposes (PA student)) Grade View: Grade I Tube type: Oral Tube size: 7.5 mm Number of attempts: 1 Airway Equipment and Method: Stylet Placement Confirmation: ETT inserted through vocal cords under direct vision,  positive ETCO2 and breath sounds checked- equal and bilateral Secured at: 22 cm Tube secured with: Tape Dental Injury: Teeth and Oropharynx as per pre-operative assessment

## 2017-08-21 NOTE — Op Note (Signed)
OPERATIVE REPORT  PREOPERATIVE DIAGNOSIS: right inguinal hernia  POSTOPERATIVE DIAGNOSIS:right  inguinal hernia  PROCEDURE:  right inguinal hernia repair  ANESTHESIA:  General  SURGEON:  Renda RollsWilton Kobyn Kray M.D.  INDICATIONS:. He reports bulging ine right groin and mild discomfort. A right inguinal hernia was demonstrated on physical exam. Repair was recomended for definitive treatment.  With the patient on the operating table in the supine position the right lower quadrant was prepared with clippers and with ChloraPrep and draped in a sterile manner. A transversely oriented suprapubic incision was made and carried down through subcutaneous tissues. Electrocautery was used for hemostasis. The Scarpa's fascia was incised. The external oblique aponeurosis was incised along the course of its fibers to open the external ring and expose the inguinal cord structures. The cord structures were mobilized. A Penrose drain was passed around the cord structures for traction. Cremaster fibers were separated to expose an indirect hernia sac. The sac was dissected free from surrounding structures. The sac was opened and its continuity with the peritoneal cavity was demonstrated. A high ligation of the sac was done with a 4-0 Vicryl suture ligature. The sac was excised and was not submitted for pathology. The stump was allowed to retract. The repair was carried with 0 Surgilon suturing the conjoined tendon to the shelving edge of the inguinal ligament.the last stitch led to satisfactory narrowing of the internal ring.  Bard soft mesh was cut to create an oval shape and was placed over the repair. This was sutured to the repair with interrupted 0 Surgilon sutures and also sutured medially to the deep fascia and on both sides of the internal ring. Next after seeing hemostasis was intact the cord structures were replaced along the floor of the inguinal canal. The cut edges of the external oblique aponeurosis were closed with a  running 4-0 Vicryl suture to re-create the external ring. The deep fascia superior and lateral to the repair site was infiltrated with half percent Sensorcaine with epinephrine. Subcutaneous tissues were also infiltrated. The Scarpa's fascia was closed with interrupted 4-0 Vicryl sutures. The skin was closed with running 4-0 Monocryl subcuticular suture and Dermabond. The testicle remained in the scrotum  The patient appeared to be in satisfactory condition and was prepared for transfer to the recovery room.  Renda RollsWilton Tremaine Earwood M.D.

## 2017-08-21 NOTE — Discharge Instructions (Addendum)
Take Tylenol or Norco if needed for pain. ° °Should not drive or do anything dangerous when taking Norco. ° °May shower and blot dry. ° °Avoid straining and heavy lifting. ° °AMBULATORY SURGERY  °DISCHARGE INSTRUCTIONS ° ° °1) The drugs that you were given will stay in your system until tomorrow so for the next 24 hours you should not: ° °A) Drive an automobile °B) Make any legal decisions °C) Drink any alcoholic beverage ° ° °2) You may resume regular meals tomorrow.  Today it is better to start with liquids and gradually work up to solid foods. ° °You may eat anything you prefer, but it is better to start with liquids, then soup and crackers, and gradually work up to solid foods. ° ° °3) Please notify your doctor immediately if you have any unusual bleeding, trouble breathing, redness and pain at the surgery site, drainage, fever, or pain not relieved by medication. ° °4) Additional Instructions: ° ° °Please contact your physician with any problems or Same Day Surgery at [phone], Monday through Friday 6 am to 4 pm, or [place] [facility] [place] Main number at [phone]. °

## 2017-08-21 NOTE — Anesthesia Preprocedure Evaluation (Signed)
Anesthesia Evaluation  Patient identified by MRN, date of birth, ID band Patient awake    Reviewed: Allergy & Precautions, H&P , NPO status , Patient's Chart, lab work & pertinent test results  History of Anesthesia Complications Negative for: history of anesthetic complications  Airway Mallampati: III  TM Distance: >3 FB Neck ROM: full    Dental  (+) Poor Dentition, Missing, Upper Dentures, Lower Dentures, Edentulous Lower, Edentulous Upper   Pulmonary neg shortness of breath, former smoker,           Cardiovascular Exercise Tolerance: Good hypertension, (-) angina(-) Past MI and (-) DOE      Neuro/Psych negative neurological ROS  negative psych ROS   GI/Hepatic negative GI ROS, Neg liver ROS, neg GERD  ,  Endo/Other  negative endocrine ROS  Renal/GU      Musculoskeletal   Abdominal   Peds  Hematology negative hematology ROS (+)   Anesthesia Other Findings Past Medical History: No date: HTN (hypertension)  Past Surgical History: No date: COLONOSCOPY  BMI    Body Mass Index:  25.37 kg/m      Reproductive/Obstetrics negative OB ROS                             Anesthesia Physical Anesthesia Plan  ASA: II  Anesthesia Plan: General ETT   Post-op Pain Management:    Induction: Intravenous  PONV Risk Score and Plan: 3 and Ondansetron, Dexamethasone, Midazolam and Treatment may vary due to age or medical condition  Airway Management Planned: Oral ETT  Additional Equipment:   Intra-op Plan:   Post-operative Plan: Extubation in OR  Informed Consent: I have reviewed the patients History and Physical, chart, labs and discussed the procedure including the risks, benefits and alternatives for the proposed anesthesia with the patient or authorized representative who has indicated his/her understanding and acceptance.   Dental Advisory Given  Plan Discussed with:  Anesthesiologist, CRNA and Surgeon  Anesthesia Plan Comments: (Patient consented for risks of anesthesia including but not limited to:  - adverse reactions to medications - damage to teeth, lips or other oral mucosa - sore throat or hoarseness - Damage to heart, brain, lungs or loss of life  Patient voiced understanding.)        Anesthesia Quick Evaluation

## 2017-08-21 NOTE — Anesthesia Post-op Follow-up Note (Signed)
Anesthesia QCDR form completed.        

## 2017-08-21 NOTE — Transfer of Care (Signed)
Immediate Anesthesia Transfer of Care Note  Patient: Marcus Kerr  Procedure(s) Performed: Procedure(s): HERNIA REPAIR INGUINAL ADULT (Right)  Patient Location: PACU  Anesthesia Type:General  Level of Consciousness: awake, alert , oriented and patient cooperative  Airway & Oxygen Therapy: Patient Spontanous Breathing and Patient connected to face mask oxygen  Post-op Assessment: Report given to RN, Post -op Vital signs reviewed and stable and Patient moving all extremities  Post vital signs: Reviewed and stable  Last Vitals:  Vitals:   08/21/17 0808 08/21/17 1058  BP: (!) 151/85 (!) 150/91  Pulse: (!) 58 67  Resp: 17 16  Temp: (!) 35.9 C 36.5 C  SpO2: 100% 100%    Last Pain:  Vitals:   08/21/17 0808  TempSrc: Tympanic         Complications: No apparent anesthesia complications

## 2017-08-21 NOTE — Anesthesia Postprocedure Evaluation (Signed)
Anesthesia Post Note  Patient: Marcus Kerr  Procedure(s) Performed: Procedure(s) (LRB): HERNIA REPAIR INGUINAL ADULT (Right)  Patient location during evaluation: PACU Anesthesia Type: General Level of consciousness: awake and alert Pain management: pain level controlled Vital Signs Assessment: post-procedure vital signs reviewed and stable Respiratory status: spontaneous breathing, nonlabored ventilation, respiratory function stable and patient connected to nasal cannula oxygen Cardiovascular status: blood pressure returned to baseline and stable Postop Assessment: no signs of nausea or vomiting Anesthetic complications: no     Last Vitals:  Vitals:   08/21/17 1204 08/21/17 1303  BP: (P) 135/73 130/69  Pulse: (!) (P) 59 61  Resp: (P) 20 12  Temp:    SpO2: (P) 100% 100%    Last Pain:  Vitals:   08/21/17 1303  TempSrc:   PainSc: 4                  Cleda MccreedyJoseph K Rosaisela Jamroz

## 2017-08-21 NOTE — H&P (Signed)
  He comes in today prepared for right inguinal hernia repair. He reports no change in overall condition since the office visit. He does report passing his urine better when taking Flomax.  Lab work noted  The right side was marked YES  I discussed the plan for right uinal hernia repair

## 2017-08-22 ENCOUNTER — Encounter: Payer: Self-pay | Admitting: Surgery

## 2017-10-20 IMAGING — CR DG ELBOW COMPLETE 3+V*R*
4 series · 4 of 4 positions shown · non-contrast
Comparison: None.

CLINICAL DATA: Posterior right elbow pain for many years. No
history of trauma.

EXAM:
RIGHT ELBOW - COMPLETE 3+ VIEW

[elbow ap (1 of 2)]
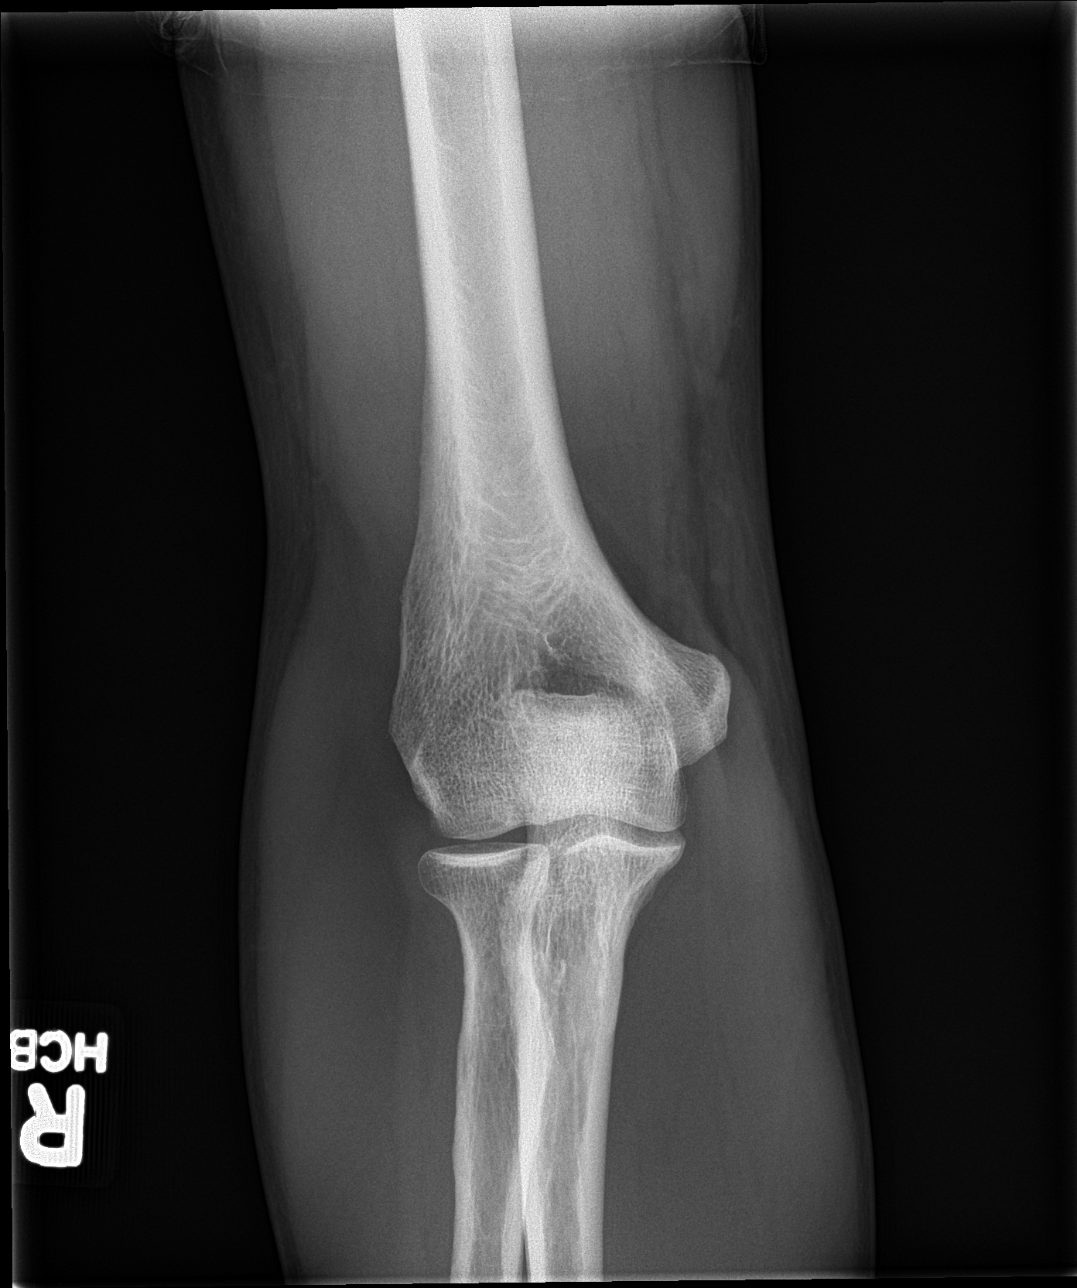

[elbow obl]
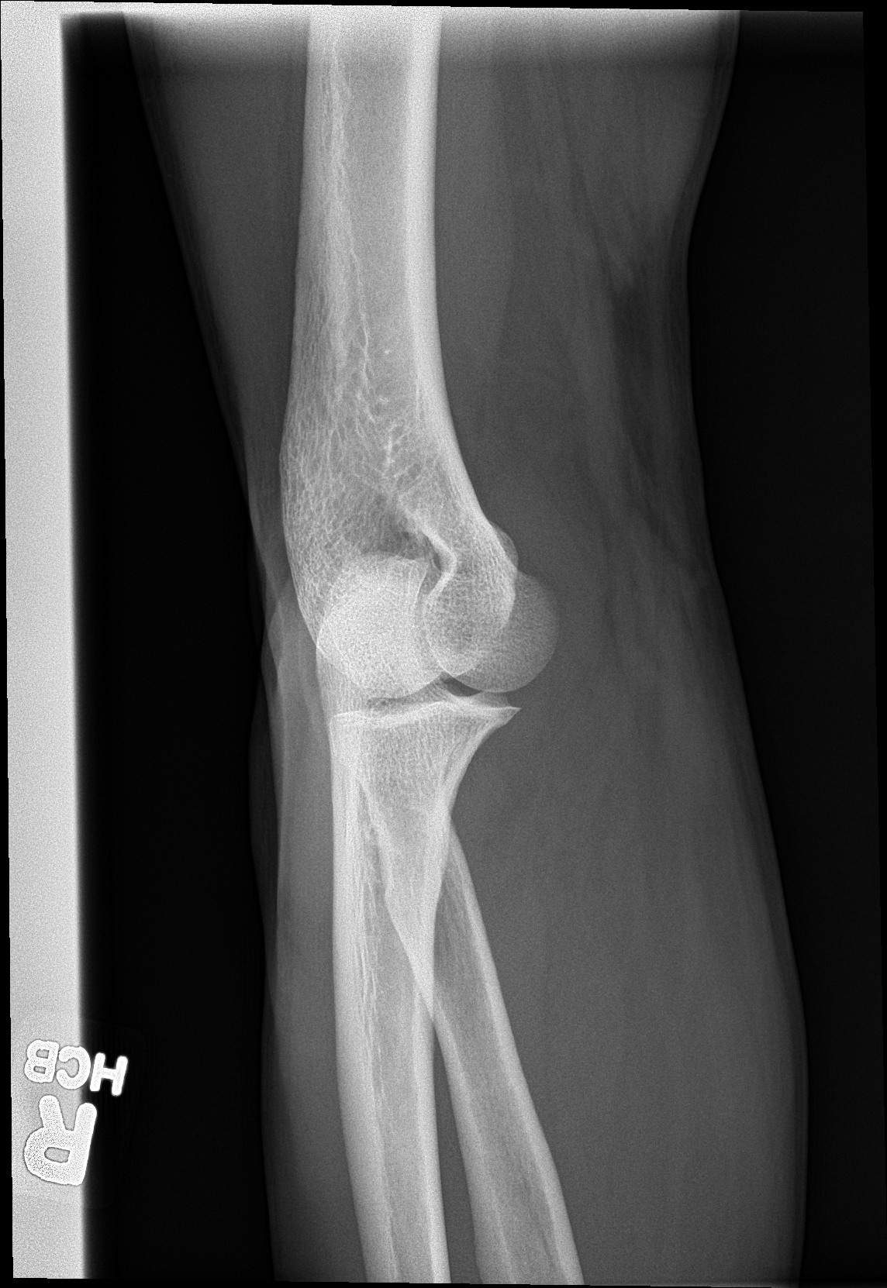

[elbow lat]
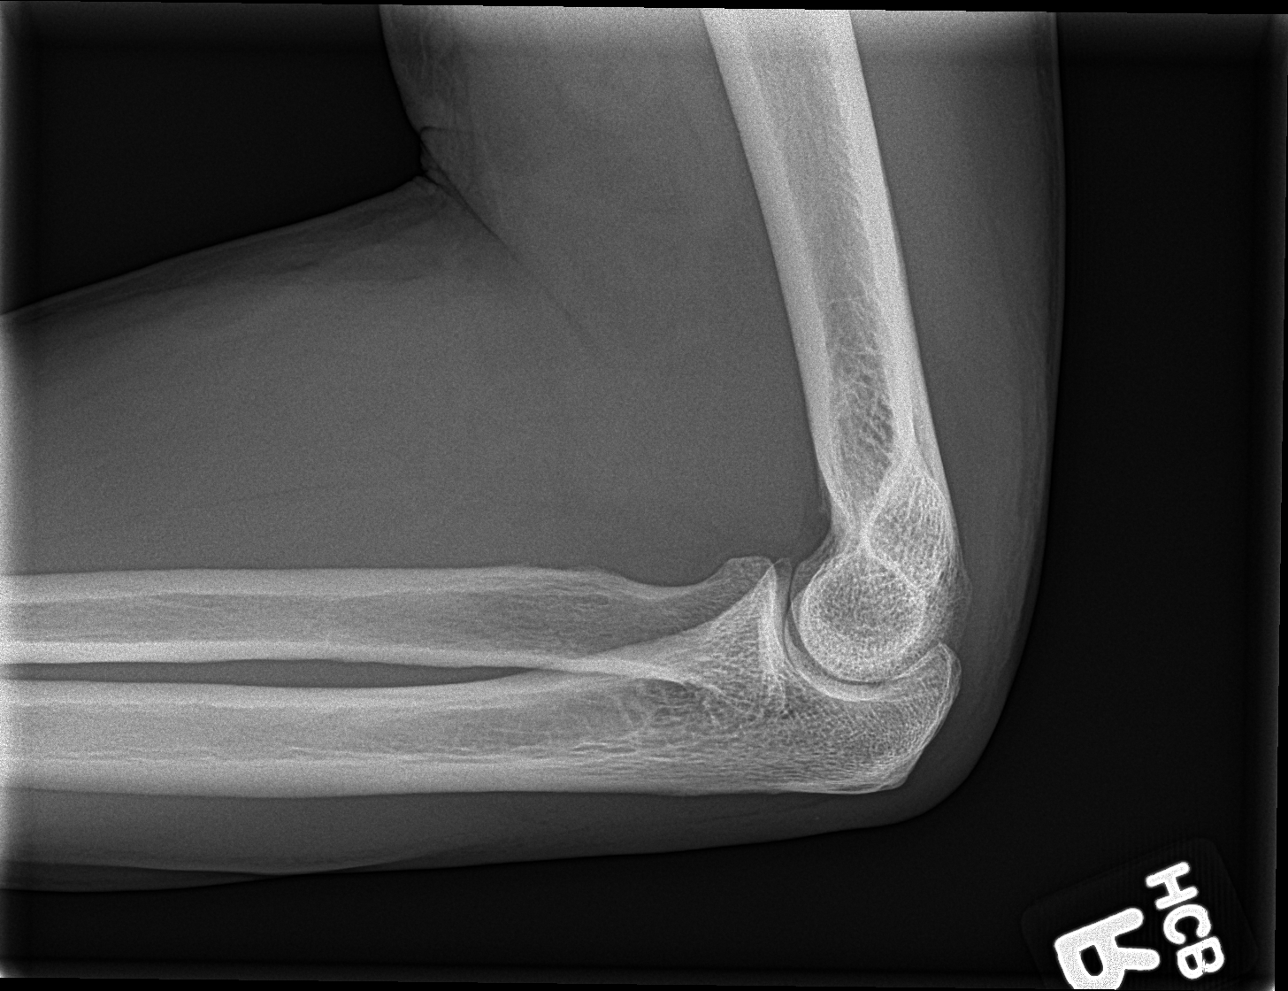

[elbow ap (2 of 2)]
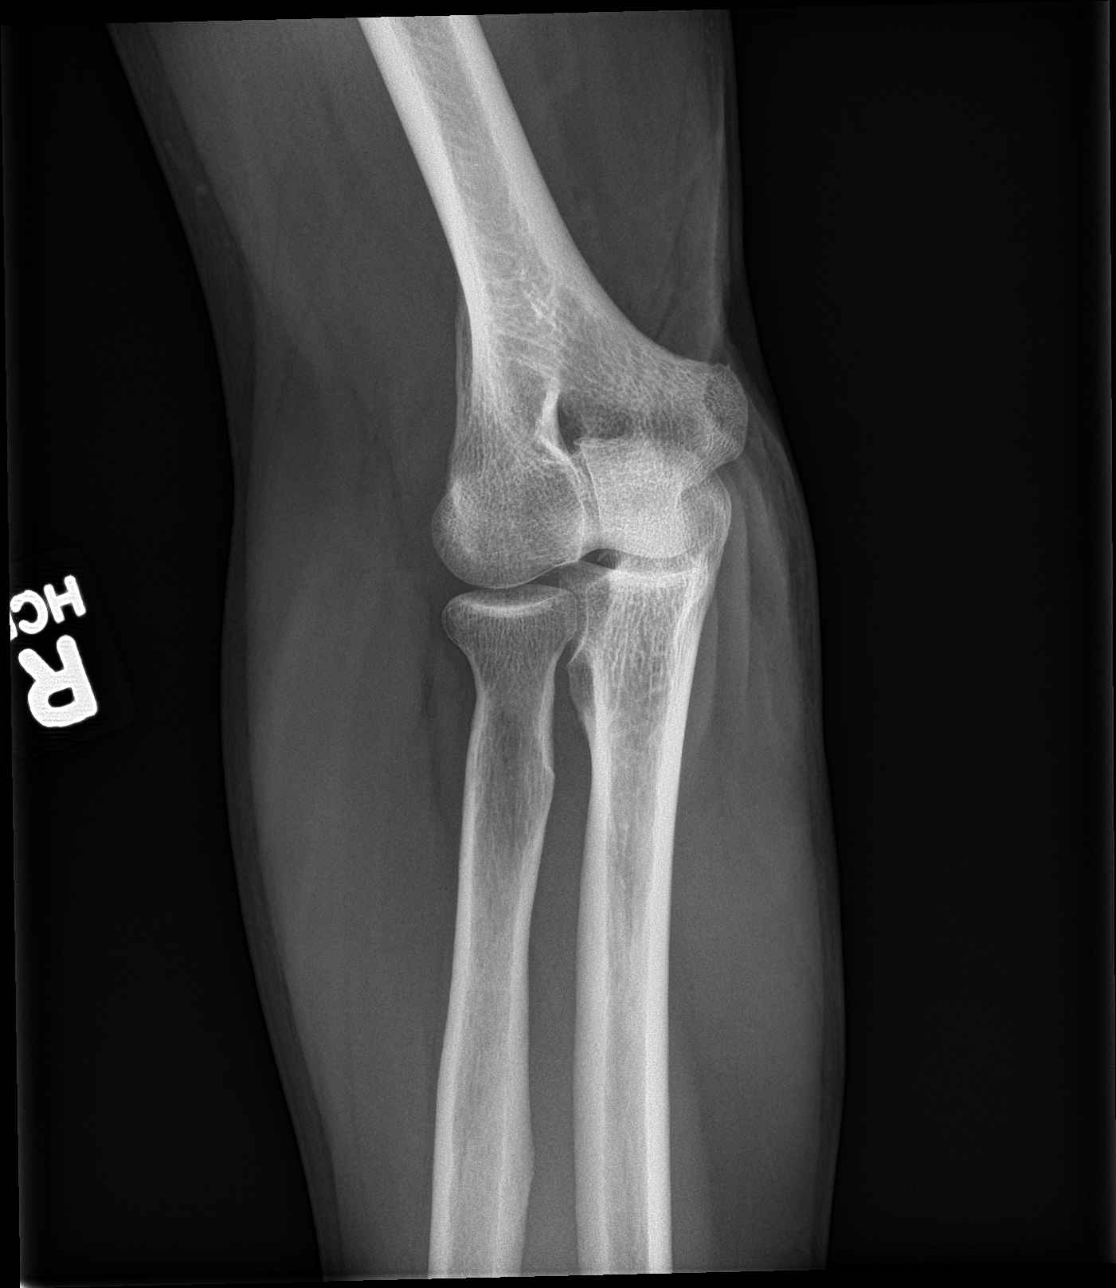

[4 of 4 positions shown; findings below may reference images not displayed]

FINDINGS: There is no evidence of fracture, dislocation, or joint effusion.
There is no evidence of arthropathy or other focal bone abnormality.
Soft tissues are unremarkable.
IMPRESSION: Negative.

## 2017-10-26 DIAGNOSIS — I10 Essential (primary) hypertension: Secondary | ICD-10-CM | POA: Diagnosis not present

## 2017-10-26 DIAGNOSIS — E7800 Pure hypercholesterolemia, unspecified: Secondary | ICD-10-CM | POA: Diagnosis not present

## 2019-06-24 DIAGNOSIS — Z125 Encounter for screening for malignant neoplasm of prostate: Secondary | ICD-10-CM | POA: Diagnosis not present

## 2019-06-24 DIAGNOSIS — N400 Enlarged prostate without lower urinary tract symptoms: Secondary | ICD-10-CM | POA: Diagnosis not present

## 2019-06-24 DIAGNOSIS — I10 Essential (primary) hypertension: Secondary | ICD-10-CM | POA: Diagnosis not present

## 2019-06-24 DIAGNOSIS — E7800 Pure hypercholesterolemia, unspecified: Secondary | ICD-10-CM | POA: Diagnosis not present

## 2019-09-23 DIAGNOSIS — H2513 Age-related nuclear cataract, bilateral: Secondary | ICD-10-CM | POA: Diagnosis not present

## 2019-09-23 DIAGNOSIS — H40003 Preglaucoma, unspecified, bilateral: Secondary | ICD-10-CM | POA: Diagnosis not present

## 2019-11-19 DIAGNOSIS — D696 Thrombocytopenia, unspecified: Secondary | ICD-10-CM | POA: Diagnosis not present

## 2019-11-19 DIAGNOSIS — Z0000 Encounter for general adult medical examination without abnormal findings: Secondary | ICD-10-CM | POA: Diagnosis not present

## 2019-11-19 DIAGNOSIS — E7800 Pure hypercholesterolemia, unspecified: Secondary | ICD-10-CM | POA: Diagnosis not present

## 2019-11-19 DIAGNOSIS — I10 Essential (primary) hypertension: Secondary | ICD-10-CM | POA: Diagnosis not present

## 2019-11-19 DIAGNOSIS — Z2821 Immunization not carried out because of patient refusal: Secondary | ICD-10-CM | POA: Diagnosis not present

## 2019-12-23 DIAGNOSIS — H40003 Preglaucoma, unspecified, bilateral: Secondary | ICD-10-CM | POA: Diagnosis not present

## 2020-02-18 DIAGNOSIS — Z760 Encounter for issue of repeat prescription: Secondary | ICD-10-CM | POA: Diagnosis not present

## 2020-02-18 DIAGNOSIS — D696 Thrombocytopenia, unspecified: Secondary | ICD-10-CM | POA: Diagnosis not present

## 2020-02-18 DIAGNOSIS — I10 Essential (primary) hypertension: Secondary | ICD-10-CM | POA: Diagnosis not present

## 2020-02-25 DIAGNOSIS — Z6825 Body mass index (BMI) 25.0-25.9, adult: Secondary | ICD-10-CM | POA: Diagnosis not present

## 2020-02-25 DIAGNOSIS — J301 Allergic rhinitis due to pollen: Secondary | ICD-10-CM | POA: Diagnosis not present

## 2020-02-25 DIAGNOSIS — N400 Enlarged prostate without lower urinary tract symptoms: Secondary | ICD-10-CM | POA: Diagnosis not present

## 2020-02-25 DIAGNOSIS — I10 Essential (primary) hypertension: Secondary | ICD-10-CM | POA: Diagnosis not present

## 2020-06-29 DIAGNOSIS — H40003 Preglaucoma, unspecified, bilateral: Secondary | ICD-10-CM | POA: Diagnosis not present

## 2020-07-28 DIAGNOSIS — H40003 Preglaucoma, unspecified, bilateral: Secondary | ICD-10-CM | POA: Diagnosis not present

## 2020-08-03 DIAGNOSIS — H40003 Preglaucoma, unspecified, bilateral: Secondary | ICD-10-CM | POA: Diagnosis not present

## 2020-08-25 DIAGNOSIS — Z2821 Immunization not carried out because of patient refusal: Secondary | ICD-10-CM | POA: Diagnosis not present

## 2020-08-25 DIAGNOSIS — I10 Essential (primary) hypertension: Secondary | ICD-10-CM | POA: Diagnosis not present

## 2020-08-25 DIAGNOSIS — E7800 Pure hypercholesterolemia, unspecified: Secondary | ICD-10-CM | POA: Diagnosis not present

## 2021-01-18 DIAGNOSIS — H401131 Primary open-angle glaucoma, bilateral, mild stage: Secondary | ICD-10-CM | POA: Diagnosis not present

## 2021-04-29 DIAGNOSIS — Z0000 Encounter for general adult medical examination without abnormal findings: Secondary | ICD-10-CM | POA: Diagnosis not present

## 2021-05-03 DIAGNOSIS — D696 Thrombocytopenia, unspecified: Secondary | ICD-10-CM | POA: Diagnosis not present

## 2021-05-03 DIAGNOSIS — I10 Essential (primary) hypertension: Secondary | ICD-10-CM | POA: Diagnosis not present

## 2021-05-03 DIAGNOSIS — Z0000 Encounter for general adult medical examination without abnormal findings: Secondary | ICD-10-CM | POA: Diagnosis not present

## 2021-08-09 DIAGNOSIS — Z760 Encounter for issue of repeat prescription: Secondary | ICD-10-CM | POA: Diagnosis not present

## 2021-08-09 DIAGNOSIS — I10 Essential (primary) hypertension: Secondary | ICD-10-CM | POA: Diagnosis not present

## 2021-08-09 DIAGNOSIS — E7800 Pure hypercholesterolemia, unspecified: Secondary | ICD-10-CM | POA: Diagnosis not present

## 2021-11-10 DIAGNOSIS — I10 Essential (primary) hypertension: Secondary | ICD-10-CM | POA: Diagnosis not present

## 2021-11-10 DIAGNOSIS — E7800 Pure hypercholesterolemia, unspecified: Secondary | ICD-10-CM | POA: Diagnosis not present

## 2022-11-10 DIAGNOSIS — N390 Urinary tract infection, site not specified: Secondary | ICD-10-CM

## 2022-11-10 HISTORY — DX: Urinary tract infection, site not specified: N39.0

## 2022-12-28 ENCOUNTER — Ambulatory Visit: Payer: Self-pay | Admitting: General Surgery

## 2022-12-28 NOTE — H&P (View-Only) (Signed)
PATIENT PROFILE: Marcus Kerr is a 77 y.o. male who presents to the Clinic for consultation at the request of Dr. Iona Beard for evaluation of left inguinal hernia.  PCP:  Atilano Median, MD  HISTORY OF PRESENT ILLNESS: Marcus Kerr reports he has been seeing a lifting hernia things month ago.  He endorses that he is feeling a lump in his left groin.  This is aggravated by straining.  No alleviating factors.  He is able to reduce the hernia.  Patient has some pain on the left groin.  No pain radiation.  Again pain aggravated by straining and certain physical activities.  Denies any episode of abdominal distention, nausea or vomiting.  Patient has had open right inguinal hernia repair.  I personally reviewed the op note of this hernia repair.   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   Right inguinal hernia 06/06/2017   Left tennis elbow 04/18/2016   Hypercholesterolemia 08/19/2015   Mixed hyperlipidemia 01/07/2015   Essential hypertension 08/07/2014   Tests: Colonoscopy 2015 WNL 03/25/2014    GENERAL REVIEW OF SYSTEMS:   General ROS: negative for - chills, fatigue, fever, weight gain or weight loss Allergy and Immunology ROS: negative for - hives  Hematological and Lymphatic ROS: negative for - bleeding problems or bruising, negative for palpable nodes Endocrine ROS: negative for - heat or cold intolerance, hair changes Respiratory ROS: negative for - cough, shortness of breath or wheezing Cardiovascular ROS: no chest pain or palpitations GI ROS: negative for nausea, vomiting, abdominal pain, diarrhea, constipation Musculoskeletal ROS: negative for - joint swelling or muscle pain Neurological ROS: negative for - confusion, syncope Dermatological ROS: negative for pruritus and rash Psychiatric: negative for anxiety, depression, difficulty sleeping and memory loss  MEDICATIONS: Current Outpatient Medications   Medication Sig Dispense Refill   amLODIPine (NORVASC) 10 MG tablet Take 1 tablet (10 mg total) by mouth once daily 90 tablet 3   aspirin 81 MG EC tablet Take 81 mg by mouth once daily.     latanoprost (XALATAN) 0.005 % ophthalmic solution      tamsulosin (FLOMAX) 0.4 mg capsule TAKE 1 CAPSULE BY MOUTH ONCE DAILY 30 MINUTES AFTER THE SAME MEAL EACH DAY 90 capsule 3   No current facility-administered medications for this visit.    ALLERGIES: Patient has no known allergies.  PAST MEDICAL HISTORY: Past Medical History:  Diagnosis Date   Hypertension     PAST SURGICAL HISTORY: Past Surgical History:  Procedure Laterality Date   HERNIA REPAIR Right 2016   Inguinal   none, none       FAMILY HISTORY: Family History  Problem Relation Age of Onset   Coronary Artery Disease (Blocked arteries around heart) Father    High blood pressure (Hypertension) Father    High blood pressure (Hypertension) Brother    Alzheimer's disease Sister    High blood pressure (Hypertension) Brother    High blood pressure (Hypertension) Brother    Cancer Neg Hx    Diabetes type II Neg Hx      SOCIAL HISTORY: Social History   Socioeconomic History   Marital status: Married  Tobacco Use   Smoking status: Former    Packs/day: 0.50    Years: 3.00    Additional pack years: 0.00    Total pack years: 1.50    Types: Cigarettes    Quit date: 12/11/1976  Years since quitting: 46.0   Smokeless tobacco: Never  Vaping Use   Vaping Use: Never used  Substance and Sexual Activity   Alcohol use: No    Alcohol/week: 0.0 standard drinks of alcohol   Drug use: Yes    Types: Other-see comments    Comment: Occasional marijuana use   Sexual activity: Yes    Partners: Female    Birth control/protection: None  Social History Narrative   Married. 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 6 children      04/29/2021   Military Service: None   Likes/Enjoys/What fills your day?:   Narrative   Home: Two Story   Your Bedrooms is on: Second Level   Fewest Steps to enter the home: 13 with railings   Other persons in the home: wife   Pets: none   Medical equipment you use daily: None   Medical equipment available in the home: None   Dental: no significant dental problems.    Vision: Denies any issues with Vision","Wears Reading glasses","Prescription Glasses","Contact Lenses"}.. Last screening Date: Feb 2022 Screening is: Up to date   Hearing: Denies any issues in Hearing .    Dermatology: Denies any areas of concern on skin.      PHYSICAL EXAM: Vitals:   12/28/22 1050  BP: 132/77  Pulse: 76   Body mass index is 24.12 kg/m. Weight: 69.9 kg (154 lb)   GENERAL: Alert, active, oriented x3  HEENT: Pupils equal reactive to light. Extraocular movements are intact. Sclera clear. Palpebral conjunctiva normal red color.Pharynx clear.  NECK: Supple with no palpable mass and no adenopathy.  LUNGS: Sound clear with no rales rhonchi or wheezes.  HEART: Regular rhythm S1 and S2 without murmur.  ABDOMEN: Soft and depressible, nontender with no palpable mass, no hepatomegaly.  Left inguinal hernia, reducible  EXTREMITIES: Well-developed well-nourished symmetrical with no dependent edema.  NEUROLOGICAL: Awake alert oriented, facial expression symmetrical, moving all extremities.  REVIEW OF DATA: I have reviewed the following data today: Office Visit on 11/15/2022  Component Date Value   Color 11/15/2022 Yellow    Clarity 11/15/2022 Cloudy (!)    Specific Gravity 11/15/2022 1.020    pH, Urine 11/15/2022 7.0    Protein, Urinalysis 11/15/2022 2+ (!)    Glucose, Urinalysis 11/15/2022 Negative    Ketones, Urinalysis 11/15/2022 Negative    Blood, Urinalysis 11/15/2022 2+ (!)    Nitrite, Urinalysis 11/15/2022 Negative    Leukocytes, Urinalysis 11/15/2022 Trace (!)    Bilirubin, Urinalysis 11/15/2022 Negative    Urobilinogen, Urinalysis 11/15/2022 0.2    Culture  Urine 11/15/2022 1,000-10,000 CFU/mL Streptococcus agalactiae, group B (!)      ASSESSMENT: Marcus Kerr is a 77 y.o. male presenting for consultation for left inguinal hernia.    The patient presents with a symptomatic, reducible left inguinal hernia. Patient was oriented about the diagnosis of inguinal hernia and its implication. The patient was oriented about the treatment alternatives (observation vs surgical repair). Due to patient symptoms, repair is recommended. Patient oriented about the surgical procedure, the use of mesh and its risk of complications such as: infection, bleeding, injury to vas deference, vasculature and testicle, injury to bowel or bladder, and chronic pain.   Non-recurrent unilateral inguinal hernia without obstruction or gangrene [K40.90]  PLAN: 1.  Robotic assisted laparoscopic left inguinal hernia repair with mesh [id number]) 2.  Avoid taking aspirin 5 days before procedure 3.  Contact us if has any question or concern.   Patient verbalized  understanding, all questions were answered, and were agreeable with the plan outlined above.    Herbert Pun, MD  Electronically signed by Herbert Pun, MD

## 2022-12-28 NOTE — H&P (Signed)
PATIENT PROFILE: Marcus Kerr is a 77 y.o. male who presents to the Clinic for consultation at the request of Dr. Iona Kerr for evaluation of left inguinal hernia.  PCP:  Marcus Median, MD  HISTORY OF PRESENT ILLNESS: Marcus Kerr reports he has been seeing a lifting hernia things month ago.  He endorses that he is feeling a lump in his left groin.  This is aggravated by straining.  No alleviating factors.  He is able to reduce the hernia.  Patient has some pain on the left groin.  No pain radiation.  Again pain aggravated by straining and certain physical activities.  Denies any episode of abdominal distention, nausea or vomiting.  Patient has had open right inguinal hernia repair.  I personally reviewed the op note of this hernia repair.   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   Right inguinal hernia 06/06/2017   Left tennis elbow 04/18/2016   Hypercholesterolemia 08/19/2015   Mixed hyperlipidemia 01/07/2015   Essential hypertension 08/07/2014   Tests: Colonoscopy 2015 WNL 03/25/2014    GENERAL REVIEW OF SYSTEMS:   General ROS: negative for - chills, fatigue, fever, weight gain or weight loss Allergy and Immunology ROS: negative for - hives  Hematological and Lymphatic ROS: negative for - bleeding problems or bruising, negative for palpable nodes Endocrine ROS: negative for - heat or cold intolerance, hair changes Respiratory ROS: negative for - cough, shortness of breath or wheezing Cardiovascular ROS: no chest pain or palpitations GI ROS: negative for nausea, vomiting, abdominal pain, diarrhea, constipation Musculoskeletal ROS: negative for - joint swelling or muscle pain Neurological ROS: negative for - confusion, syncope Dermatological ROS: negative for pruritus and rash Psychiatric: negative for anxiety, depression, difficulty sleeping and memory loss  MEDICATIONS: Current Outpatient Medications   Medication Sig Dispense Refill   amLODIPine (NORVASC) 10 MG tablet Take 1 tablet (10 mg total) by mouth once daily 90 tablet 3   aspirin 81 MG EC tablet Take 81 mg by mouth once daily.     latanoprost (XALATAN) 0.005 % ophthalmic solution      tamsulosin (FLOMAX) 0.4 mg capsule TAKE 1 CAPSULE BY MOUTH ONCE DAILY 30 MINUTES AFTER THE SAME MEAL EACH DAY 90 capsule 3   No current facility-administered medications for this visit.    ALLERGIES: Patient has no known allergies.  PAST MEDICAL HISTORY: Past Medical History:  Diagnosis Date   Hypertension     PAST SURGICAL HISTORY: Past Surgical History:  Procedure Laterality Date   HERNIA REPAIR Right 2016   Inguinal   none, none       FAMILY HISTORY: Family History  Problem Relation Age of Onset   Coronary Artery Disease (Blocked arteries around heart) Father    High blood pressure (Hypertension) Father    High blood pressure (Hypertension) Brother    Alzheimer's disease Sister    High blood pressure (Hypertension) Brother    High blood pressure (Hypertension) Brother    Cancer Neg Hx    Diabetes type II Neg Hx      SOCIAL HISTORY: Social History   Socioeconomic History   Marital status: Married  Tobacco Use   Smoking status: Former    Packs/day: 0.50    Years: 3.00    Additional pack years: 0.00    Total pack years: 1.50    Types: Cigarettes    Quit date: 12/11/1976  Years since quitting: 46.0   Smokeless tobacco: Never  Vaping Use   Vaping Use: Never used  Substance and Sexual Activity   Alcohol use: No    Alcohol/week: 0.0 standard drinks of alcohol   Drug use: Yes    Types: Other-see comments    Comment: Occasional marijuana use   Sexual activity: Yes    Partners: Female    Birth control/protection: None  Social History Narrative   Married. 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    Vision: Denies any issues with Vision","Wears Reading glasses","Prescription Glasses","Contact Lenses"}.. Last screening Date: Feb 2022 Screening is: Up to date   Hearing: Denies any issues in Hearing .    Dermatology: Denies any areas of concern on skin.      PHYSICAL EXAM: Vitals:   12/28/22 1050  BP: 132/77  Pulse: 76   Body mass index is 24.12 kg/m. Weight: 69.9 kg (154 lb)   GENERAL: Alert, active, oriented x3  HEENT: Pupils equal reactive to light. Extraocular movements are intact. Sclera clear. Palpebral conjunctiva normal red color.Pharynx clear.  NECK: Supple with no palpable mass and no adenopathy.  LUNGS: Sound clear with no rales rhonchi or wheezes.  HEART: Regular rhythm S1 and S2 without murmur.  ABDOMEN: Soft and depressible, nontender with no palpable mass, no hepatomegaly.  Left inguinal hernia, reducible  EXTREMITIES: Well-developed well-nourished symmetrical with no dependent edema.  NEUROLOGICAL: Awake alert oriented, facial expression symmetrical, moving all extremities.  REVIEW OF DATA: I have reviewed the following data today: Office Visit on 11/15/2022  Component Date Value   Color 11/15/2022 Yellow    Clarity 11/15/2022 Cloudy (!)    Specific Gravity 11/15/2022 1.020    pH, Urine 11/15/2022 7.0    Protein, Urinalysis 11/15/2022 2+ (!)    Glucose, Urinalysis 11/15/2022 Negative    Ketones, Urinalysis 11/15/2022 Negative    Blood, Urinalysis 11/15/2022 2+ (!)    Nitrite, Urinalysis 11/15/2022 Negative    Leukocytes, Urinalysis 11/15/2022 Trace (!)    Bilirubin, Urinalysis 11/15/2022 Negative    Urobilinogen, Urinalysis 11/15/2022 0.2    Culture  Urine 11/15/2022 1,000-10,000 CFU/mL Streptococcus agalactiae, group B (!)      ASSESSMENT: Marcus Kerr is a 77 y.o. male presenting for consultation for left inguinal hernia.    The patient presents with a symptomatic, reducible left inguinal hernia. Patient was oriented about the diagnosis of inguinal hernia and its implication. The patient was oriented about the treatment alternatives (observation vs surgical repair). Due to patient symptoms, repair is recommended. Patient oriented about the surgical procedure, the use of mesh and its risk of complications such as: infection, bleeding, injury to vas deference, vasculature and testicle, injury to bowel or bladder, and chronic pain.   Non-recurrent unilateral inguinal hernia without obstruction or gangrene [K40.90]  PLAN: 1.  Robotic assisted laparoscopic left inguinal hernia repair with mesh [id number]) 2.  Avoid taking aspirin 5 days before procedure 3.  Contact us if has any question or concern.   Patient verbalized  understanding, all questions were answered, and were agreeable with the plan outlined above.    Herbert Pun, MD  Electronically signed by Herbert Pun, MD

## 2023-01-15 ENCOUNTER — Encounter
Admission: RE | Admit: 2023-01-15 | Discharge: 2023-01-15 | Disposition: A | Discharge status: Home / Self Care | Payer: PPO | Source: Ambulatory Visit | Attending: General Surgery | Admitting: General Surgery

## 2023-01-15 VITALS — Ht 67.0 in | Wt 154.1 lb

## 2023-01-15 DIAGNOSIS — Z01812 Encounter for preprocedural laboratory examination: Secondary | ICD-10-CM

## 2023-01-15 DIAGNOSIS — I350 Nonrheumatic aortic (valve) stenosis: Secondary | ICD-10-CM

## 2023-01-15 DIAGNOSIS — Z01810 Encounter for preprocedural cardiovascular examination: Secondary | ICD-10-CM

## 2023-01-15 DIAGNOSIS — I10 Essential (primary) hypertension: Secondary | ICD-10-CM

## 2023-01-15 HISTORY — DX: Bilateral inguinal hernia, without obstruction or gangrene, not specified as recurrent: K40.20

## 2023-01-15 HISTORY — DX: Hyperlipidemia, unspecified: E78.5

## 2023-01-15 HISTORY — DX: Nonrheumatic aortic (valve) stenosis: I35.0

## 2023-01-15 NOTE — Patient Instructions (Addendum)
Your procedure is scheduled on:01-24-23 Wednesday Report to the Registration Desk on the 1st floor of the [facility].Then proceed to the 2nd floor Surgery Desk To find out your arrival time, please call [phone] between 1PM - 3PM on:01-23-23 Tuesday If your arrival time is 6:00 am, do not arrive before that time as the [facility] entrance doors do not open until 6:00 am.  REMEMBER: Instructions that are not followed completely may result in serious medical risk, up to and including death; or upon the discretion of your surgeon and anesthesiologist your surgery may need to be rescheduled.  Do not eat food OR drink any liquids after midnight the night before surgery.  No gum chewing or hard candies.  One week prior to surgery: Stop Anti-inflammatories (NSAIDS) such as Advil, Aleve, Ibuprofen, Motrin, Naproxen, Naprosyn and Aspirin based products such as Excedrin, Goody's Powder, BC Powder.You may however, continue to take Tylenol if needed for pain up until the day of surgery.  Stop ANY OVER THE COUNTER supplements/vitamins NOW (01-15-23) until after surgery (Vitamin A, C,E, and D)  TAKE THESE MEDICATIONS THE MORNING OF SURGERY WITH A SIP OF WATER: -amLODipine (NORVASC)  -tamsulosin [facility])   Stop your 81 mg Aspirin 5 days prior to surgery per Cintron-Diaz-Last dose will be on 01-18-23 Thursday  No Alcohol for 24 hours before or after surgery.  No Smoking including e-cigarettes for 24 hours before surgery.  No chewable tobacco products for at least 6 hours before surgery.  No nicotine patches on the day of surgery.  Do not use any "recreational" drugs for at least a week (preferably 2 weeks) before your surgery.  Please be advised that the combination of cocaine and anesthesia may have negative outcomes, up to and including death. If you test positive for cocaine, your surgery will be cancelled.  On the morning of surgery brush your teeth with toothpaste and water, you may rinse  your mouth with mouthwash if you wish. Do not swallow any toothpaste or mouthwash.  Use CHG Soap as directed on instruction sheet.  Do not wear jewelry, make-up, hairpins, clips or nail polish.  Do not wear lotions, powders, or perfumes.   Do not shave body hair from the neck down 48 hours before surgery.  Contact lenses, hearing aids and dentures may not be worn into surgery.  Do not bring valuables to the hospital. [facility] is not responsible for any missing/lost belongings or valuables.   Notify your doctor if there is any change in your medical condition (cold, fever, infection).  Wear comfortable clothing (specific to your surgery type) to the hospital.  After surgery, you can help prevent lung complications by doing breathing exercises.  Take deep breaths and cough every 1-2 hours. Your doctor may order a device called an Incentive Spirometer to help you take deep breaths. When coughing or sneezing, hold a pillow firmly against your incision with both hands. This is called "splinting." Doing this helps protect your incision. It also decreases belly discomfort.  If you are being admitted to the hospital overnight, leave your suitcase in the car. After surgery it may be brought to your room.  In case of increased patient census, it may be necessary for you, the patient, to continue your postoperative care in the Same Day Surgery department.  If you are being discharged the day of surgery, you will not be allowed to drive home. You will need a responsible individual to drive you home and stay with you for 24  hours after surgery.   If you are taking public transportation, you will need to have a responsible individual with you.  Please call the [facility] Dept. at [phone] if you have any questions about these instructions.  Surgery Visitation Policy:  Patients undergoing a surgery or procedure may have two family members or support persons with them as long  as the person is not COVID-19 positive or experiencing its symptoms.   Due to an increase in RSV and influenza rates and associated hospitalizations, children ages 60 and under will not be able to visit patients in [facility]. Masks continue to be strongly recommended.

## 2023-01-18 ENCOUNTER — Encounter
Admission: RE | Admit: 2023-01-18 | Discharge: 2023-01-18 | Disposition: A | Discharge status: Home / Self Care | Payer: PPO | Source: Ambulatory Visit | Attending: General Surgery | Admitting: General Surgery

## 2023-01-18 DIAGNOSIS — Z01812 Encounter for preprocedural laboratory examination: Secondary | ICD-10-CM

## 2023-01-18 DIAGNOSIS — I10 Essential (primary) hypertension: Secondary | ICD-10-CM | POA: Diagnosis not present

## 2023-01-18 DIAGNOSIS — I350 Nonrheumatic aortic (valve) stenosis: Secondary | ICD-10-CM | POA: Diagnosis not present

## 2023-01-18 DIAGNOSIS — Z01818 Encounter for other preprocedural examination: Secondary | ICD-10-CM | POA: Insufficient documentation

## 2023-01-18 DIAGNOSIS — Z01810 Encounter for preprocedural cardiovascular examination: Secondary | ICD-10-CM

## 2023-01-18 LAB — BASIC METABOLIC PANEL
Anion gap: 7 (ref 5–15)
BUN: 17 mg/dL (ref 8–23)
CO2: 27 mmol/L (ref 22–32)
Calcium: 8.9 mg/dL (ref 8.9–10.3)
Chloride: 102 mmol/L (ref 98–111)
Creatinine, Ser: 1.06 mg/dL (ref 0.61–1.24)
GFR, Estimated: >60 mL/min (ref >60)
Glucose, Bld: 99 mg/dL (ref 70–99)
Potassium: 3.5 mmol/L (ref 3.5–5.1)
Sodium: 136 mmol/L (ref 135–145)

## 2023-01-18 LAB — CBC
HCT: 43.1 % (ref 39.0–52.0)
Hemoglobin: 13.8 g/dL (ref 13.0–17.0)
MCH: 26.3 pg (ref 26.0–34.0)
MCHC: 32 g/dL (ref 30.0–36.0)
MCV: 82.1 fL (ref 80.0–100.0)
Platelets: 166 10*3/uL (ref 150–400)
RBC: 5.25 MIL/uL (ref 4.22–5.81)
RDW: 14.3 % (ref 11.5–15.5)
WBC: 6.6 10*3/uL (ref 4.0–10.5)
nRBC: 0 % (ref 0.0–0.2)

## 2023-01-23 ENCOUNTER — Encounter: Payer: Self-pay | Admitting: General Surgery

## 2023-01-23 MED ORDER — CEFAZOLIN SODIUM-DEXTROSE 2-4 GM/100ML-% IV SOLN
2.0000 g | INTRAVENOUS | Status: AC
  Administered 2023-01-24: 2 g via INTRAVENOUS

## 2023-01-23 MED ORDER — CHLORHEXIDINE GLUCONATE 0.12 % MT SOLN: 15.0000 mL | Freq: Once | OROMUCOSAL | Status: AC

## 2023-01-23 MED ORDER — FAMOTIDINE 20 MG PO TABS
20.0000 mg | ORAL_TABLET | Freq: Once | ORAL | Status: AC
  Administered 2023-01-24: 20 mg via ORAL

## 2023-01-23 MED ORDER — LACTATED RINGERS IV SOLN: INTRAVENOUS | Status: DC

## 2023-01-23 MED ORDER — ORAL CARE MOUTH RINSE: 15.0000 mL | Freq: Once | OROMUCOSAL | Status: AC

## 2023-01-23 NOTE — Progress Notes (Signed)
Perioperative / Anesthesia Services  Pre-Admission Testing Clinical Review / Preoperative Anesthesia Consult  Date: 01/23/23  Patient Demographics:  Name: Marcus Kerr DOB:   1946/03/24 MRN:   [id number]  Planned Surgical Procedure(s):    Case: [id number] Date/Time: 01/24/23 0940   Procedure: XI ROBOTIC ASSISTED INGUINAL HERNIA REPAIR WITH MESH (Left: Groin)   Anesthesia type: General   Pre-op diagnosis: K40.90 Non recurrent unilateral inguinal hernia w/o obstruction or gangrene   Location: [facility] OR ROOM 04 / [facility]   Surgeons: Herbert Pun, MD   NOTE: Available PAT nursing documentation and vital signs have been reviewed. Clinical nursing staff has updated patient's PMH/PSHx, current medication list, and drug allergies/intolerances to ensure comprehensive history available to assist in medical decision making as it pertains to the aforementioned surgical procedure and anticipated anesthetic course. Extensive review of available clinical information personally performed. [place] PMH and PSHx updated with any diagnoses/procedures that  may have been inadvertently omitted during his intake with the pre-admission testing department's nursing staff.  Clinical Discussion:  Marcus Kerr is a 77 y.o. male who is submitted for pre-surgical anesthesia review and clearance prior to him undergoing the above procedure. Patient is a Former Smoker (quit 07/1976). Pertinent PMH includes: aortic stenosis, cardiac murmur, BILATERAL iliac artery atherosclerosis, HTN, HLD, BPH, occasional marijuana use.   Patient is followed by cardiology Nehemiah Massed, MD). He was last seen in the cardiology clinic on 05/24/2022; notes reviewed. At the time of his clinic visit, patient doing well overall from a cardiovascular perspective. Patient denied any chest pain, shortness of breath, PND, orthopnea, palpitations, significant peripheral edema, weakness, fatigue, vertiginous symptoms,  or presyncope/syncope. Patient with a past medical history significant for cardiovascular diagnoses. Documented physical exam was grossly benign, providing no evidence of acute exacerbation and/or decompensation of the patient's known cardiovascular conditions.  Most recent TTE was performed on 04/13/2022 revealing a normal left ventricular systolic function with an EF of >55%.  Left ventricular GLS -19.7%.  There was mild left atrial and right ventricular enlargement.  Trivial to mild pan valvular regurgitation noted.  Study also demonstrated mild progression of patient's known aortic valve stenosis.  Mean transaortic valve pressure gradient 23 mmHg.  No mitral valve stenosis noted.  Blood pressure well controlled at 120/74 mmHg on currently prescribed CCB (amlodipine) monotherapy.  Given patient's risk ASCVD, he was counseled on the need to be on lipid-lowering medications to prevent complications in the future.  Patient declined.  HLD and ASCVD prevention are currently being managed with diet lifestyle modification alone.  Patient is not diabetic. He does not have an OSAH diagnosis. Functional capacity, as defined by DASI, is documented as being >/= 4 METS. No changes were made to his medication regimen during his visit with cardiology.  Patient scheduled to follow-up with outpatient cardiology in 1 year or sooner if needed.  [name] is scheduled for an elective XI ROBOTIC ASSISTED INGUINAL HERNIA REPAIR WITH MESH (Left: Groin) on 01/24/2023 with Dr. Herbert Pun, MD.  Given patient's past medical history significant for cardiovascular diagnoses, presurgical cardiac clearance was sought by the PAT team. Per cardiology, "this patient is optimized for surgery and may proceed with the planned procedural course with a LOW risk of significant perioperative cardiovascular complications".  In review of his medication reconciliation, it is noted the patient is on daily antiplatelet therapy.  He  has been instructed on recommendations from his cardiologist and general surgeon for holding his low-dose ASA for 5  days prior to his surgery with plans to restart as soon as postoperative bleeding risk to be minimized by his primary attending surgeon.  The patient is aware that his last dose of ASA should be on 01/18/2023.  Patient denies previous perioperative complications with anesthesia in the past. In review of the available records, it is noted that patient underwent a general anesthetic course here at [facility] (ASA [facility]) in 08/2017 without documented complications.      01/15/2023    3:00 PM 08/21/2017    1:03 PM 08/21/2017   12:04 PM  Vitals with BMI  Height 5' 7"$     Weight 154 lbs 2 oz    BMI [id number]    Systolic  [id number]  Diastolic  69 73  Pulse  61 59    Providers/Specialists:   NOTE: Primary physician provider listed below. Patient may have been seen by APP or partner within same practice.   PROVIDER ROLE / SPECIALTY LAST Tanna Savoy, MD General Surgery (Surgeon) 12/28/2022  Sharyne Peach, MD Primary Care Provider 11/15/2022  Serafina Royals, MD Cardiology 05/24/2022   Allergies:  Patient has no known allergies.  Current Home Medications:   No current facility-administered medications for this encounter.    amLODipine (NORVASC) 10 MG tablet   Ascorbic Acid (VITAMIN C PO)   aspirin EC 81 MG tablet   tamsulosin (FLOMAX) 0.4 MG CAPS capsule   VITAMIN A PO   VITAMIN D PO   VITAMIN E PO   History:   Past Medical History:  Diagnosis Date   Aortic stenosis    a).) stress echo 02/09/2015: EF 55%, mild LVH, mild MR/TR, mod AR, mild AS; b.) TTE 04/13/2022: EF >55%, GLS -19.7%, mild LAE/RVE, triv MR/TR/PR, mild AR, mod AS (MPG 23)   Atherosclerosis of both iliac arteries    Bilateral inguinal hernia (BIH)    a.) s/p RIGHT repair 08/21/2017   BPH (benign prostatic hyperplasia)    Cardiac murmur    HTN (hypertension)     Hyperlipidemia    Occasional use of marijuana    Past Surgical History:  Procedure Laterality Date   COLONOSCOPY     INGUINAL HERNIA REPAIR Right 08/21/2017   Procedure: HERNIA REPAIR INGUINAL ADULT;  Surgeon: Leonie Green, MD;  Location: [facility] ORS;  Service: General;  Laterality: Right;   No family history on file. Social History   Tobacco Use   Smoking status: Former    Types: Cigarettes    Quit date: 08/10/1976    Years since quitting: 46.4   Smokeless tobacco: Never  Vaping Use   Vaping Use: Never used  Substance Use Topics   Alcohol use: Yes    Alcohol/week: 1.0 standard drink of alcohol    Types: 1 Glasses of wine per week    Comment: rare   Drug use: Yes    Types: Marijuana    Pertinent Clinical Results:  LABS:   No visits with results within 3 Day(s) from this visit.  Latest known visit with results is:  Hospital Outpatient Visit on 01/18/2023  Component Date Value Ref Range Status   WBC 01/18/2023 6.6  4.0 - 10.5 K/uL Final   RBC 01/18/2023 5.25  4.22 - 5.81 MIL/uL Final   Hemoglobin 01/18/2023 13.8  13.0 - 17.0 g/dL Final   HCT 01/18/2023 43.1  39.0 - 52.0 % Final   MCV 01/18/2023 82.1  80.0 - 100.0 fL Final   MCH 01/18/2023 26.3  26.0 - 34.0 pg Final   MCHC 01/18/2023 32.0  30.0 - 36.0 g/dL Final   RDW 01/18/2023 14.3  11.5 - 15.5 % Final   Platelets 01/18/2023 166  150 - 400 K/uL Final   nRBC 01/18/2023 0.0  0.0 - 0.2 % Final   Performed at [facility], [place], [place] [address]   Sodium 01/18/2023 136  135 - 145 mmol/L Final   Potassium 01/18/2023 3.5  3.5 - 5.1 mmol/L Final   Chloride 01/18/2023 102  98 - 111 mmol/L Final   CO2 01/18/2023 27  22 - 32 mmol/L Final   Glucose, Bld 01/18/2023 99  70 - 99 mg/dL Final   Glucose reference range applies only to samples taken after fasting for at least 8 hours.   BUN 01/18/2023 17  8 - 23 mg/dL Final   Creatinine, Ser 01/18/2023 1.06  0.61 - 1.24 mg/dL Final   Calcium  01/18/2023 8.9  8.9 - 10.3 mg/dL Final   GFR, Estimated 01/18/2023 >60  >60 mL/min Final   Comment: (NOTE) Calculated using the CKD-EPI Creatinine Equation (2021)    Anion gap 01/18/2023 7  5 - 15 Final   Performed at [facility], [address] [place], [place] [address]    ECG: Date: 01/18/2023 Time ECG obtained: 0955 AM Rate: 66 bpm Rhythm: normal sinus Axis (leads I and aVF): Normal Intervals: PR 134 ms. QRS 104 ms. QTc 396 ms. ST segment and T wave changes: No evidence of acute ST segment elevation or depression Comparison: Similar to previous tracing obtained on 08/10/2017   IMAGING / PROCEDURES: TRANSTHORACIC ECHOCARDIOGRAM performed on 04/13/2022 Normal left ventricular systolic function with an EF of >55% No regional wall motion abnormalities Left ventricular GLS -19.7%  Mild left atrial and right ventricular enlargement Trivial MR, TR, PR Mild AR Moderate aortic stenosis; mean pressure gradient 23 mmHg  Impression and Plan:  PAYCEN CASEBOLT has been referred for pre-anesthesia review and clearance prior to him undergoing the planned anesthetic and procedural courses. Available labs, pertinent testing, and imaging results were personally reviewed by me in preparation for upcoming operative/procedural course. [facility] medical record has been updated following extensive record review and patient interview with PAT staff.   This patient has been appropriately cleared by cardiology with an overall LOW risk of significant perioperative cardiovascular complications. Based on clinical review performed today (01/23/23), barring any significant acute changes in the patient's overall condition, it is anticipated that he will be able to proceed with the planned surgical intervention. Any acute changes in clinical condition may necessitate his procedure being postponed and/or cancelled. Patient will meet with anesthesia team (MD and/or CRNA) on the day of his procedure  for preoperative evaluation/assessment. Questions regarding anesthetic course will be fielded at that time.   Pre-surgical instructions were reviewed with the patient during his PAT appointment, and questions were fielded to satisfaction by PAT clinical staff. He has been instructed on which medications that he will need to hold prior to surgery, as well as the ones that have been deemed safe/appropriate to take of the day of his procedure. As part of the general education provided by PAT, patient made aware both verbally and in writing, that he would need to abstain from the use of any illegal substances during his perioperative course.  He was advised that failure to follow the provided instructions could necessitate case cancellation or result serious perioperative complications up to and including death. Patient encouraged to  contact PAT and/or his surgeon's office to discuss any questions or concerns that may arise prior to surgery; verbalized understanding.   Honor Loh, MSN, APRN, FNP-C, CEN [facility]  Peri-operative Services Nurse Practitioner Phone: [phone] Fax: [phone] 01/23/23 6:38 AM  NOTE: This note has been prepared using Dragon dictation software. Despite my best ability to proofread, there is always the potential that unintentional transcriptional errors may still occur from this process.

## 2023-01-24 ENCOUNTER — Ambulatory Visit: Payer: PPO | Admitting: Urgent Care

## 2023-01-24 ENCOUNTER — Other Ambulatory Visit: Payer: Self-pay

## 2023-01-24 ENCOUNTER — Ambulatory Visit
Admission: RE | Admit: 2023-01-24 | Discharge: 2023-01-24 | Disposition: A | Discharge status: Home / Self Care | Payer: PPO | Source: Ambulatory Visit | Attending: General Surgery | Admitting: General Surgery

## 2023-01-24 ENCOUNTER — Encounter: Payer: Self-pay | Admitting: General Surgery

## 2023-01-24 ENCOUNTER — Encounter
Admission: RE | Disposition: A | Discharge status: Home / Self Care | Payer: Self-pay | Source: Ambulatory Visit | Attending: General Surgery

## 2023-01-24 DIAGNOSIS — Z09 Encounter for follow-up examination after completed treatment for conditions other than malignant neoplasm: Secondary | ICD-10-CM | POA: Diagnosis not present

## 2023-01-24 DIAGNOSIS — I10 Essential (primary) hypertension: Secondary | ICD-10-CM | POA: Insufficient documentation

## 2023-01-24 DIAGNOSIS — K4090 Unilateral inguinal hernia, without obstruction or gangrene, not specified as recurrent: Secondary | ICD-10-CM | POA: Diagnosis present

## 2023-01-24 DIAGNOSIS — Z87891 Personal history of nicotine dependence: Secondary | ICD-10-CM | POA: Insufficient documentation

## 2023-01-24 DIAGNOSIS — E782 Mixed hyperlipidemia: Secondary | ICD-10-CM | POA: Insufficient documentation

## 2023-01-24 HISTORY — PX: XI ROBOTIC ASSISTED INGUINAL HERNIA REPAIR WITH MESH: SHX6706

## 2023-01-24 HISTORY — DX: Benign prostatic hyperplasia without lower urinary tract symptoms: N40.0

## 2023-01-24 HISTORY — DX: Cannabis use, unspecified, uncomplicated: F12.90

## 2023-01-24 HISTORY — DX: Atherosclerosis of other arteries: I70.8

## 2023-01-24 HISTORY — DX: Cardiac murmur, unspecified: R01.1

## 2023-01-24 SURGERY — REPAIR, HERNIA, INGUINAL, ROBOT-ASSISTED, LAPAROSCOPIC, USING MESH
Anesthesia: General | Site: Groin | Laterality: Left

## 2023-01-24 MED ORDER — PHENYLEPHRINE HCL (PRESSORS) 10 MG/ML IV SOLN
INTRAVENOUS | Status: DC | PRN
  Administered 2023-01-24 (×2): 240 ug via INTRAVENOUS
  Administered 2023-01-24: 160 ug via INTRAVENOUS

## 2023-01-24 MED ORDER — PHENYLEPHRINE HCL-NACL 20-0.9 MG/250ML-% IV SOLN
INTRAVENOUS | Status: AC
  Filled 2023-01-24: qty 250

## 2023-01-24 MED ORDER — ROCURONIUM BROMIDE 100 MG/10ML IV SOLN
INTRAVENOUS | Status: DC | PRN
  Administered 2023-01-24: 30 mg via INTRAVENOUS
  Administered 2023-01-24 (×2): 10 mg via INTRAVENOUS

## 2023-01-24 MED ORDER — OXYCODONE HCL 5 MG/5ML PO SOLN
5.0000 mg | Freq: Once | ORAL | Status: AC | PRN
  Administered 2023-01-24: 5 mg via ORAL

## 2023-01-24 MED ORDER — LACTATED RINGERS IV SOLN: INTRAVENOUS | Status: DC | PRN

## 2023-01-24 MED ORDER — FENTANYL CITRATE (PF) 100 MCG/2ML IJ SOLN
25.0000 ug | INTRAMUSCULAR | Status: DC | PRN
  Administered 2023-01-24 (×2): 50 ug via INTRAVENOUS

## 2023-01-24 MED ORDER — FENTANYL CITRATE (PF) 100 MCG/2ML IJ SOLN
INTRAMUSCULAR | Status: AC
  Administered 2023-01-24: 25 ug via INTRAVENOUS
  Filled 2023-01-24: qty 2

## 2023-01-24 MED ORDER — LIDOCAINE HCL (CARDIAC) PF 100 MG/5ML IV SOSY
PREFILLED_SYRINGE | INTRAVENOUS | Status: DC | PRN
  Administered 2023-01-24: 40 mg via INTRAVENOUS

## 2023-01-24 MED ORDER — ACETAMINOPHEN 10 MG/ML IV SOLN: 1000.0000 mg | Freq: Once | INTRAVENOUS | Status: DC | PRN

## 2023-01-24 MED ORDER — DEXAMETHASONE SODIUM PHOSPHATE 10 MG/ML IJ SOLN
INTRAMUSCULAR | Status: DC | PRN
  Administered 2023-01-24: 5 mg via INTRAVENOUS

## 2023-01-24 MED ORDER — ONDANSETRON HCL 4 MG/2ML IJ SOLN: 4.0000 mg | Freq: Once | INTRAMUSCULAR | Status: DC | PRN

## 2023-01-24 MED ORDER — ACETAMINOPHEN 10 MG/ML IV SOLN
INTRAVENOUS | Status: DC | PRN
  Administered 2023-01-24: 1000 mg via INTRAVENOUS

## 2023-01-24 MED ORDER — ONDANSETRON HCL 4 MG/2ML IJ SOLN
INTRAMUSCULAR | Status: AC
  Filled 2023-01-24: qty 2

## 2023-01-24 MED ORDER — PROPOFOL 10 MG/ML IV BOLUS
INTRAVENOUS | Status: DC | PRN
  Administered 2023-01-24: 150 mg via INTRAVENOUS

## 2023-01-24 MED ORDER — ROCURONIUM BROMIDE 10 MG/ML (PF) SYRINGE
PREFILLED_SYRINGE | INTRAVENOUS | Status: AC
  Filled 2023-01-24: qty 10

## 2023-01-24 MED ORDER — ACETAMINOPHEN 10 MG/ML IV SOLN
INTRAVENOUS | Status: AC
  Filled 2023-01-24: qty 100

## 2023-01-24 MED ORDER — OXYCODONE HCL 5 MG PO TABS: 5.0000 mg | ORAL_TABLET | Freq: Once | ORAL | Status: AC | PRN

## 2023-01-24 MED ORDER — ETOMIDATE 2 MG/ML IV SOLN
INTRAVENOUS | Status: AC
  Filled 2023-01-24: qty 10

## 2023-01-24 MED ORDER — FENTANYL CITRATE (PF) 100 MCG/2ML IJ SOLN
INTRAMUSCULAR | Status: DC | PRN
  Administered 2023-01-24: 50 ug via INTRAVENOUS

## 2023-01-24 MED ORDER — BUPIVACAINE HCL (PF) 0.25 % IJ SOLN
INTRAMUSCULAR | Status: DC | PRN
  Administered 2023-01-24: 30 mL

## 2023-01-24 MED ORDER — LACTATED RINGERS IV SOLN: INTRAVENOUS | Status: DC

## 2023-01-24 MED ORDER — PROPOFOL 10 MG/ML IV BOLUS
INTRAVENOUS | Status: AC
  Filled 2023-01-24: qty 20

## 2023-01-24 MED ORDER — SUGAMMADEX SODIUM 200 MG/2ML IV SOLN
INTRAVENOUS | Status: DC | PRN
  Administered 2023-01-24: 200 mg via INTRAVENOUS

## 2023-01-24 MED ORDER — PHENYLEPHRINE HCL-NACL 20-0.9 MG/250ML-% IV SOLN
INTRAVENOUS | Status: DC | PRN
  Administered 2023-01-24: 25 ug/min via INTRAVENOUS
  Administered 2023-01-24: 40 ug/min via INTRAVENOUS

## 2023-01-24 MED ORDER — FENTANYL CITRATE (PF) 100 MCG/2ML IJ SOLN
INTRAMUSCULAR | Status: AC
  Filled 2023-01-24: qty 2

## 2023-01-24 MED ORDER — CHLORHEXIDINE GLUCONATE 0.12 % MT SOLN
OROMUCOSAL | Status: AC
  Administered 2023-01-24: 15 mL via OROMUCOSAL
  Filled 2023-01-24: qty 15

## 2023-01-24 MED ORDER — FAMOTIDINE 20 MG PO TABS
ORAL_TABLET | ORAL | Status: AC
  Filled 2023-01-24: qty 1

## 2023-01-24 MED ORDER — HYDROCODONE-ACETAMINOPHEN 5-325 MG PO TABS: 1.0000 | ORAL_TABLET | ORAL | 0 refills | Status: AC | PRN

## 2023-01-24 MED ORDER — OXYCODONE HCL 5 MG PO TABS
ORAL_TABLET | ORAL | Status: AC
  Filled 2023-01-24: qty 1

## 2023-01-24 MED ORDER — ONDANSETRON HCL 4 MG/2ML IJ SOLN
INTRAMUSCULAR | Status: DC | PRN
  Administered 2023-01-24: 4 mg via INTRAVENOUS

## 2023-01-24 MED ORDER — CEFAZOLIN SODIUM-DEXTROSE 2-4 GM/100ML-% IV SOLN
INTRAVENOUS | Status: AC
  Filled 2023-01-24: qty 100

## 2023-01-24 SURGICAL SUPPLY — 49 items
ADH SKN CLS APL DERMABOND .7 (GAUZE/BANDAGES/DRESSINGS) ×1
BAG PRESSURE INF REUSE 1000 (BAG) IMPLANT
BLADE SURG SZ11 CARB STEEL (BLADE) ×1 IMPLANT
COVER TIP SHEARS 8 DVNC (MISCELLANEOUS) ×1 IMPLANT
COVER TIP SHEARS 8MM DA VINCI (MISCELLANEOUS) ×1
COVER WAND RF STERILE (DRAPES) ×1 IMPLANT
DERMABOND ADVANCED .7 DNX12 (GAUZE/BANDAGES/DRESSINGS) ×1 IMPLANT
DRAPE ARM DVNC X/XI (DISPOSABLE) ×3 IMPLANT
DRAPE COLUMN DVNC XI (DISPOSABLE) ×1 IMPLANT
DRAPE DA VINCI XI ARM (DISPOSABLE) ×3
DRAPE DA VINCI XI COLUMN (DISPOSABLE) ×1
ELECT REM PT RETURN 9FT ADLT (ELECTROSURGICAL) ×1
ELECTRODE REM PT RTRN 9FT ADLT (ELECTROSURGICAL) ×1 IMPLANT
GLOVE BIO SURGEON STRL SZ 6.5 (GLOVE) ×2 IMPLANT
GLOVE BIOGEL PI IND STRL 6.5 (GLOVE) ×2 IMPLANT
GOWN STRL REUS W/ TWL LRG LVL3 (GOWN DISPOSABLE) ×3 IMPLANT
GOWN STRL REUS W/TWL LRG LVL3 (GOWN DISPOSABLE) ×4
IRRIGATOR SUCT 8 DISP DVNC XI (IRRIGATION / IRRIGATOR) IMPLANT
IRRIGATOR SUCTION 8MM XI DISP (IRRIGATION / IRRIGATOR)
IV CATH ANGIO 12GX3 LT BLUE (NEEDLE) IMPLANT
IV NS 1000ML (IV SOLUTION)
IV NS 1000ML BAXH (IV SOLUTION) IMPLANT
KIT PINK PAD W/HEAD ARE REST (MISCELLANEOUS) ×1
KIT PINK PAD W/HEAD ARM REST (MISCELLANEOUS) ×1 IMPLANT
LABEL OR SOLS (LABEL) IMPLANT
MANIFOLD NEPTUNE II (INSTRUMENTS) ×1 IMPLANT
MESH 3DMAX MID 5X7 LT XLRG (Mesh General) IMPLANT
NDL INSUFFLATION 14GA 120MM (NEEDLE) ×1 IMPLANT
NEEDLE HYPO 22GX1.5 SAFETY (NEEDLE) ×1 IMPLANT
NEEDLE INSUFFLATION 14GA 120MM (NEEDLE) ×1 IMPLANT
OBTURATOR OPTICAL STANDARD 8MM (TROCAR) ×1
OBTURATOR OPTICAL STND 8 DVNC (TROCAR) ×1
OBTURATOR OPTICALSTD 8 DVNC (TROCAR) ×1 IMPLANT
PACK LAP CHOLECYSTECTOMY (MISCELLANEOUS) ×1 IMPLANT
SEAL CANN UNIV 5-8 DVNC XI (MISCELLANEOUS) ×3 IMPLANT
SEAL XI 5MM-8MM UNIVERSAL (MISCELLANEOUS) ×3
SET TUBE SMOKE EVAC HIGH FLOW (TUBING) ×1 IMPLANT
SOL ELECTROSURG ANTI STICK (MISCELLANEOUS) ×1
SOLUTION ELECTROSURG ANTI STCK (MISCELLANEOUS) ×1 IMPLANT
SUT MNCRL 4-0 (SUTURE) ×2
SUT MNCRL 4-0 27XMFL (SUTURE) ×2
SUT VIC AB 2-0 SH 27 (SUTURE) ×2
SUT VIC AB 2-0 SH 27XBRD (SUTURE) ×1 IMPLANT
SUT VLOC 90 S/L VL9 GS22 (SUTURE) ×1 IMPLANT
SUTURE MNCRL 4-0 27XMF (SUTURE) ×1 IMPLANT
TAPE TRANSPORE STRL 2 31045 (GAUZE/BANDAGES/DRESSINGS) IMPLANT
TRAP FLUID SMOKE EVACUATOR (MISCELLANEOUS) ×1 IMPLANT
TRAY FOLEY MTR SLVR 16FR STAT (SET/KITS/TRAYS/PACK) ×1 IMPLANT
WATER STERILE IRR 500ML POUR (IV SOLUTION) ×1 IMPLANT

## 2023-01-24 NOTE — Anesthesia Procedure Notes (Signed)
Procedure Name: Intubation Date/Time: 01/24/2023 10:10 AM  Performed by: Hilbert Odor, CRNAPre-anesthesia Checklist: Patient identified, Patient being monitored, Timeout performed, Emergency Drugs available and Suction available Patient Re-evaluated:Patient Re-evaluated prior to induction Oxygen Delivery Method: Circle system utilized Preoxygenation: Pre-oxygenation with 100% oxygen Induction Type: IV induction Ventilation: Mask ventilation without difficulty and Oral airway inserted - appropriate to patient size Laryngoscope Size: 3 and McGraph Grade View: Grade I Tube type: Oral Tube size: 7.5 mm Number of attempts: 1 Airway Equipment and Method: Stylet Placement Confirmation: ETT inserted through vocal cords under direct vision, positive ETCO2 and breath sounds checked- equal and bilateral Secured at: 22 cm Tube secured with: Tape Dental Injury: Teeth and Oropharynx as per pre-operative assessment

## 2023-01-24 NOTE — Interval H&P Note (Signed)
History and Physical Interval Note:  01/24/2023 9:28 AM  Marcus Kerr  has presented today for surgery, with the diagnosis of K40.90 Non recurrent unilateral inguinal hernia w/o obstruction or gangrene.  The various methods of treatment have been discussed with the patient and family. After consideration of risks, benefits and other options for treatment, the patient has consented to  Procedure(s): XI ROBOTIC [place] (Left) as a surgical intervention.  The patient's history has been reviewed, patient examined, no change in status, stable for surgery.  I have reviewed the patient's chart and labs.  Questions were answered to the patient's satisfaction.     Herbert Pun

## 2023-01-24 NOTE — Op Note (Signed)
Preoperative diagnosis: Left inguinal hernia.   Postoperative diagnosis: Left inguinal hernia.  Procedure: Robotic assisted Laparoscopic Transabdominal preperitoneal laparoscopic (TAPP) repair of left inguinal hernia.  Anesthesia: GETA  Surgeon: Dr. Windell Moment  Wound Classification: Clean  Indications:  Patient is a 77 y.o. male developed a symptomatic left inguinal hernia. Repair was indicated.  Findings: 1. Left indirect Inguinal hernia identified 2. Vas deferens and cord structures identified and preserved 3. Bard Extra Large 3D Max MID Anatomical mesh used for repair 4. Adequate hemostasis.   Description of procedure:  The patient was taken to the operating room and the correct side of surgery was verified. The patient was placed supine with right arm tucked at the side. After obtaining adequate anesthesia, the patient's abdomen was prepped and draped in standard sterile fashion. A time-out was completed verifying correct patient, procedure, site, positioning, and implant(s) and/or special equipment prior to beginning this procedure.  An incision was made in a natural skin line above the umbilicus. The fascia was elevated and the Veress needle inserted. Proper position was confirmed by aspiration and saline meniscus test.  The abdomen was insufflated with carbon dioxide to a pressure of 15 mmHg. The patient tolerated insufflation well.  Abdominal cavity was entered using Optiview technique with a millimeter trocar.  No injury was identified.  Another 2 mm trocars were placed lateral to each rectus muscle.  Scissors and bipolar forceps were inserted under direct visualization. At the robotic console: Transverse peritoneal incision is made about 8 cm superior to the inguinal defect. Medial to the epigastric vessels, the parietal compartment is dissected to visualize the rectus muscle. This is carried down to the symphysis pubis and the retropubic space is dissected to expose at least 2 cm  contralateral to the midline. Cooper's ligament is exposed and cleared at least 2 cm below the ligament to ensure adequate space for the inferior border of the mesh. Hesselbach's triangle is cleared assessing for a direct hernia.  Lateral to the epigastric vessels, the dissection is carried out in visceral compartment continuing in the true preperitoneal plane. Indirect hernia sac, was carefully reduced and separated from the cord structures with medial retraction and a combination of blunt/sharp dissection and focused cautery. This dissection was continued until the cord structures are "parietalized" completely, allowing for visualization of the reflected peritoneum that is continuous with the line originating 2 cm below Coopers medially and across the psoas muscle in the lateral compartment.  The internal ring was interrogated for a cord lipoma. The cord lipoma was reduced to the retroperitoneum and seated dorsal to the preperitoneal mesh. Having achieved a complete dissection with a critical view of the entire myopectineal orifice, an XL mesh was then positioned centered at the iliopubic tract with the medial side crossing the midline and the inferior edge positioned 2 cm below Coopers ligament. The lateral aspect of the mesh extended 3-5 cm beyond the lateral edge of the psoas. The mesh is fixated using an interrupted suture placed to the ipsilateral Coopers ligament. A second suture was done at the medial superior aspect of the mesh fixating this to the rectus complex.  The peritoneal flap is closed with running barbed suture. Additional holes in the peritoneum closed with suture. Preperitoneal space gas aspirated to visualize the peritoneum apposed directly against the mesh and ensure no folding, lifting, or buckling of the mesh. Skin is closed, sterile dressings are applied.  The patient tolerated the procedure well and was taken to the postanesthesia care unit in  stable condition  Specimen:  None  Complications: None  Estimated Blood Loss: 5 mL

## 2023-01-24 NOTE — Transfer of Care (Signed)
Immediate Anesthesia Transfer of Care Note  Patient: Marcus Kerr  Procedure(s) Performed: XI ROBOTIC ASSISTED INGUINAL HERNIA REPAIR WITH MESH (Left: Groin)  Patient Location: PACU  Anesthesia Type:General  Level of Consciousness: drowsy and patient cooperative  Airway & Oxygen Therapy: Patient Spontanous Breathing and Patient connected to nasal cannula oxygen  Post-op Assessment: Report given to RN, Post -op Vital signs reviewed and stable, and Patient moving all extremities X 4  Post vital signs: Reviewed and stable  Last Vitals:  Vitals Value Taken Time  BP 141/77 01/24/23 1151  Temp    Pulse 72 01/24/23 1155  Resp 16 01/24/23 1155  SpO2 100 % 01/24/23 1155  Vitals shown include unvalidated device data.  Last Pain:  Vitals:   01/24/23 0829  TempSrc: Temporal  PainSc: 0-No pain      Patients Stated Pain Goal: 0 ([id number])  Complications: No notable events documented.

## 2023-01-24 NOTE — Anesthesia Preprocedure Evaluation (Addendum)
Anesthesia Evaluation  Patient identified by MRN, date of birth, ID band Patient awake    Reviewed: Allergy & Precautions, NPO status , Patient's Chart, lab work & pertinent test results  History of Anesthesia Complications Negative for: history of anesthetic complications  Airway Mallampati: IV   Neck ROM: Full    Dental  (+) Upper Dentures, Lower Dentures   Pulmonary former smoker (quit 1977)   Pulmonary exam normal breath sounds clear to auscultation       Cardiovascular hypertension, + Valvular Problems/Murmurs (moderate AS)  Rhythm:Regular Rate:Normal + Systolic murmurs ECG [id number]:  Normal sinus rhythm Nonspecific T wave abnormality Abnormal ECG When compared with ECG of 10-Aug-2017 10:08, No significant change was found  Echo 04/13/22:  NORMAL LEFT VENTRICULAR SYSTOLIC FUNCTION    NORMAL LA PRESSURES WITH NORMAL DIASTOLIC FUNCTION    NORMAL RIGHT VENTRICULAR SYSTOLIC FUNCTION    VALVULAR REGURGITATION: MILD AR, TRIVIAL MR, TRIVIAL PR, TRIVIAL TR    VALVULAR STENOSIS: MODERATE AS    MODERATE AORTIC STENOIS WITH MILD AR    Compared with prior Echo study on 02/09/2015: AORTIC STENOIS IS NOW    MODERATE; NO SIGNIFICANT CHANGES IN FUNCTION    Neuro/Psych Occasional marijuana use    GI/Hepatic negative GI ROS,,,  Endo/Other  negative endocrine ROS    Renal/GU negative Renal ROS   BPH    Musculoskeletal   Abdominal   Peds  Hematology negative hematology ROS (+)   Anesthesia Other Findings Reviewed and agree with Bayard Males pre-anesthesia clinical review note.    Cardiology note 05/24/22:  77 y.o. male with  Encounter Diagnoses  Name Primary?  Essential hypertension  Mixed hyperlipidemia  Moderate aortic valve stenosis Yes   Plan  -There has been a discussion of the current guidelines for hypertension control. We will continue current medical regimen for hypertension control which will also help in  risk factor modification of cardiovascular disease. The patient understands and agrees with the current plan. We will be watching for possible future side effects of these medications. Additional home blood pressure monitoring is recommended if able. -No further cardiovascular intervention and/or medication additions due to only minimal abnormality of cholesterol levels and low 10 year cardiovascular risk score. The patient will continue healthy lifestyle measures to achieve appropriate cardiovascular risk reduction. -Continue to follow for any significant aortic valve stenosis symptoms including shortness of breath, chest pain, syncope, and/or heart failure. The patient will continue regular physical activity and monitor for these symptoms listed above. We will plan on follow-up and further echocardiographic and/or functional assessment in the future as needed depending on symptoms and activity levels. The patient currently understands the pathophysiology of aortic stenosis. -Patient was counseled today about the significance of diet and exercise for further risk reduction of cardiovascular events, risk factor reduction, maintaining an appropriate body mass index and weight, and possible improvements in quality of life measures. These lifestyle modifications were discussed including a walking regimen and/or exercise prescription, a diet rich in fruits and vegetables, the DASH diet, and appropriate use of medication management.  No orders of the defined types were placed in this encounter.  Return in about 1 year (around 05/25/2023).    Reproductive/Obstetrics                             Anesthesia Physical Anesthesia Plan  ASA: 3  Anesthesia Plan: General   Post-op Pain Management:    Induction: Intravenous  PONV Risk Score  and Plan: 2 and Ondansetron, Dexamethasone and Treatment may vary due to age or medical condition  Airway Management Planned: Oral  ETT  Additional Equipment:   Intra-op Plan:   Post-operative Plan: Extubation in OR  Informed Consent: I have reviewed the patients History and Physical, chart, labs and discussed the procedure including the risks, benefits and alternatives for the proposed anesthesia with the patient or authorized representative who has indicated his/her understanding and acceptance.     Dental advisory given  Plan Discussed with: CRNA  Anesthesia Plan Comments: (Patient consented for risks of anesthesia including but not limited to:  - adverse reactions to medications - damage to eyes, teeth, lips or other oral mucosa - nerve damage due to positioning  - sore throat or hoarseness - damage to heart, brain, nerves, lungs, other parts of body or loss of life  Informed patient about role of CRNA in peri- and intra-operative care.  Patient voiced understanding.)        Anesthesia Quick Evaluation

## 2023-01-24 NOTE — Anesthesia Postprocedure Evaluation (Signed)
Anesthesia Post Note  Patient: Marcus Kerr  Procedure(s) Performed: XI ROBOTIC ASSISTED INGUINAL HERNIA REPAIR WITH MESH (Left: Groin)  Patient location during evaluation: PACU Anesthesia Type: General Level of consciousness: awake and alert, oriented and patient cooperative Pain management: pain level controlled Vital Signs Assessment: post-procedure vital signs reviewed and stable Respiratory status: spontaneous breathing, nonlabored ventilation and respiratory function stable Cardiovascular status: blood pressure returned to baseline and stable Postop Assessment: adequate PO intake Anesthetic complications: no   No notable events documented.   Last Vitals:  Vitals:   01/24/23 1215 01/24/23 1237  BP: 128/82 (!) 160/83  Pulse: 71 71  Resp: 13 20  Temp: 36.7 C (!) 36 C  SpO2: 96% 99%    Last Pain:  Vitals:   01/24/23 1237  TempSrc: Temporal  PainSc: 0-No pain                 Darrin Nipper

## 2023-01-24 NOTE — Discharge Instructions (Addendum)

## 2023-01-24 NOTE — Progress Notes (Signed)
Patient awake/alert x4. Asking appropriate questions.  Tolerating sprite and crackers without event.  Medicated as prescribed

## 2023-01-25 ENCOUNTER — Encounter: Payer: Self-pay | Admitting: General Surgery

## 2023-02-15 ENCOUNTER — Encounter: Payer: Self-pay | Admitting: Family Medicine

## 2023-02-15 ENCOUNTER — Other Ambulatory Visit: Payer: Self-pay | Admitting: Family Medicine

## 2023-02-15 DIAGNOSIS — R3129 Other microscopic hematuria: Secondary | ICD-10-CM

## 2023-02-23 ENCOUNTER — Ambulatory Visit
Admission: RE | Admit: 2023-02-23 | Discharge: 2023-02-23 | Disposition: A | Discharge status: Home / Self Care | Payer: PPO | Source: Ambulatory Visit | Attending: Family Medicine | Admitting: Family Medicine

## 2023-02-23 DIAGNOSIS — R3129 Other microscopic hematuria: Secondary | ICD-10-CM | POA: Insufficient documentation

## 2023-02-23 MED ORDER — IOHEXOL 300 MG/ML  SOLN
100.0000 mL | Freq: Once | INTRAMUSCULAR | Status: AC | PRN
  Administered 2023-02-23: 100 mL via INTRAVENOUS

## 2023-03-06 ENCOUNTER — Ambulatory Visit (INDEPENDENT_AMBULATORY_CARE_PROVIDER_SITE_OTHER): Payer: PPO | Admitting: Urology

## 2023-03-06 ENCOUNTER — Encounter: Payer: Self-pay | Admitting: Urology

## 2023-03-06 VITALS — BP 151/81 | HR 98 | Ht 67.0 in | Wt 155.4 lb

## 2023-03-06 DIAGNOSIS — R3129 Other microscopic hematuria: Secondary | ICD-10-CM

## 2023-03-06 DIAGNOSIS — R300 Dysuria: Secondary | ICD-10-CM

## 2023-03-06 DIAGNOSIS — R102 Pelvic and perineal pain: Secondary | ICD-10-CM | POA: Diagnosis not present

## 2023-03-06 DIAGNOSIS — R8281 Pyuria: Secondary | ICD-10-CM

## 2023-03-06 DIAGNOSIS — N410 Acute prostatitis: Secondary | ICD-10-CM

## 2023-03-06 MED ORDER — DOXYCYCLINE HYCLATE 100 MG PO CAPS: 100.0000 mg | ORAL_CAPSULE | Freq: Two times a day (BID) | ORAL | 0 refills | Status: DC

## 2023-03-06 NOTE — Progress Notes (Signed)
   03/06/23 3:57 PM   Marcus Kerr [birth year] [id number]  CC: Microscopic hematuria and pyuria, dysuria and urinary symptoms  HPI: 77 year old male who reports at least 1 to 2 months of worsening urinary symptoms with some dysuria and pelvic discomfort.  He had multiple urine samples that showed both microscopic hematuria and pyuria, but urine cultures were negative.  His PCP ordered a CT urogram for evaluation of microscopic hematuria which showed a large posterior bladder diverticulum, but no other abnormalities.  He denies any gross hematuria.  He recently underwent a procedure in February 2024 with general surgery for hernia repair, he is unsure if he had a catheter at that time.   PMH: Past Medical History:  Diagnosis Date   Aortic stenosis    a).) stress echo 02/09/2015: EF 55%, mild LVH, mild MR/TR, mod AR, mild AS; b.) TTE 04/13/2022: EF >55%, GLS -19.7%, mild LAE/RVE, triv MR/TR/PR, mild AR, mod AS (MPG 23)   Atherosclerosis of both iliac arteries    Bilateral inguinal hernia (BIH)    a.) s/p RIGHT repair 08/21/2017   BPH (benign prostatic hyperplasia)    Cardiac murmur    HTN (hypertension)    Hyperlipidemia    Occasional use of marijuana     Surgical History: Past Surgical History:  Procedure Laterality Date   COLONOSCOPY     INGUINAL HERNIA REPAIR Right 08/21/2017   Procedure: HERNIA REPAIR INGUINAL ADULT;  Surgeon: Leonie Green, MD;  Location: [facility] ORS;  Service: General;  Laterality: Right;   XI ROBOTIC ASSISTED INGUINAL HERNIA REPAIR WITH MESH Left 01/24/2023   Procedure: XI ROBOTIC ASSISTED INGUINAL HERNIA REPAIR WITH MESH;  Surgeon: Herbert Pun, MD;  Location: [facility] ORS;  Service: General;  Laterality: Left;    Family History: No family history on file.  Social History:  reports that he quit smoking about 46 years ago. His smoking use included cigarettes. He has never used smokeless tobacco. He reports current alcohol use of about 1.0  standard drink of alcohol per week. He reports current drug use. Drug: Marijuana.  Physical Exam: BP (!) 151/81 (BP Location: Left Arm, Patient Position: Sitting, Cuff Size: Large)   Pulse 98   Ht 5\' 7"  (1.702 m)   Wt 155 lb 6.4 oz (70.5 kg)   BMI 24.34 kg/m    Constitutional:  Alert and oriented, No acute distress. Cardiovascular: No clubbing, cyanosis, or edema. Respiratory: Normal respiratory effort, no increased work of breathing. GI: Abdomen is soft, nontender, nondistended, no abdominal masses   Pertinent Imaging: I have personally viewed and interpreted the CT urogram showing a large bladder diverticulum, otherwise benign from a urologic perspective with no stones or filling defects.  Assessment & Plan:   77 year old male with microscopic hematuria and pyuria of unclear etiology, possible prostatitis.  We reviewed the AUA guidelines regarding microscopic hematuria, as well as his reassuring CT findings.  We discussed considering cystoscopy.  With his symptoms of dysuria and pelvic pain this may represent prostatitis, and I recommended a 2-week course of doxycycline, with close follow-up for repeat urinalysis and PVR, with plan for cystoscopy at that visit if persistent microscopic hematuria or symptoms.  Doxycycline 100 mg twice daily x 2 weeks for possible prostatitis RTC 3 to 4 weeks for UA and PVR, proceed with cystoscopy at that visit if persistent microscopic hematuria and/or urinary symptoms   Nickolas Madrid, MD 03/06/2023  [facility] [address] [place], [place] [address] [phone]

## 2023-03-06 NOTE — Patient Instructions (Signed)
Prostatitis  Prostatitis is swelling or inflammation of the prostate gland, also called the prostate. This gland is about 1.5 inches wide and 1 inch high, and it is involved in making semen. The prostate is located below a man's bladder, in front of the rectum. There are four types of prostatitis: Chronic prostatitis (CP), also called chronic pelvic pain syndrome (CPPS). This is the most common type of prostatitis. It is associated with increased muscle tone in the area between the hip bones (pelvic area), around the prostate. This type is also known as a pelvic floor disorder. Chronic bacterial prostatitis. This type usually results from an acute bacterial infection in the prostate gland that keeps coming back or has not been treated properly. The symptoms are less severe than those caused by acute bacterial prostatitis, which lasts a shorter time. Asymptomatic inflammatory prostatitis. This type does not have symptoms and does not need treatment. This is diagnosed when tests are done for other disorders of the urinary tract or reproductive tract. Acute bacterial prostatitis. This type starts quickly and results from an acute bacterial infection in the prostate gland. It is usually associated with a bladder infection, high fever, and chills. This is the least common type of prostatitis. What are the causes? Bacterial prostatitis is caused by an infection from bacteria. Chronic nonbacterial prostatitis may be caused by: Factors related to the nervous system. This system includes thebrain, spinal cord, and nerves. An autoimmune response. This happens when the body's disease-fighting system attacks healthy tissue in the body by mistake. Psychological factors. These have to do with how the mind works. The causes of the other types of prostatitis are usually not known. What are the signs or symptoms? Symptoms of this condition depend on the type of prostatitis you have. Acute bacterial  prostatitis Symptoms may include: Pain or burning during urination. Frequent and sudden urges to urinate. Trouble starting to urinate. Fever. Chills. Pain in your muscles or joints, lower back, or lower abdomen. Other types of prostatitis Symptoms may include: Sudden urges to urinate, or urinating often. Trouble starting to urinate. Weak urine stream. Dribbling after urination. Discharge coming from the penis. Pain in the testicles, the penis, or the tip of the penis. Pain in the area in front of the rectum and below the scrotum (perineum). Pain when ejaculating. How is this diagnosed? This condition may be diagnosed based on: A physical and medical exam. A digital rectal exam. For this, the health care provider may use a finger to feel the prostate. A urine test to check for bacteria. A semen sample or blood tests. Ultrasound. Urodynamic tests to check how your body handles urine. Cystoscopy to look inside your bladder or inside the part of your body that drains urine from the bladder (urethra). How is this treated? Treatment for this condition depends on the type of prostatitis. Treatment may involve: Medicines to relieve pain or inflammation, or to help relax your muscles. Physical therapy. Heat therapy. Biofeedback. These techniques help you control certain body functions. Relaxation exercises. Antibiotic medicine, if your condition is caused by bacteria. Sitz baths. These warm water baths help to relax your pelvic floor muscles, which helps to relieve pressure on the prostate. Follow these instructions at home: Medicines Take over-the-counter and prescription medicines only as told by your health care provider. If you were prescribed an antibiotic medicine, take it as told by your health care provider. Do not stop using the antibiotic even if you start to feel better. Managing pain and swelling    Take sitz baths as directed by your health care provider. For a sitz bath,  sit in warm water that is deep enough to cover your hips and buttocks. If directed, apply heat to the affected area as often as told by your health care provider. Use the heat source that your health care provider recommends, such as a moist heat pack or a heating pad. Place a towel between your skin and the heat source. Leave the heat on for 20-30 minutes. Remove the heat if your skin turns bright red. This is especially important if you are unable to feel pain, heat, or cold. You may have a greater risk of getting burned. General instructions Do exercises as told by your health care provider, if you were prescribed physical therapy, biofeedback, or relaxation exercises. Keep all follow-up visits as told by your health care provider. This is important. Where to find more information [employer] of Diabetes and Digestive and Kidney Diseases: http://www.bass.com/ Contact a health care provider if: Your symptoms get worse. You have a fever. Get help right away if: You have chills. You feel light-headed or feel like you may faint. You cannot urinate. You have blood or blood clots in your urine. Summary Prostatitis is swelling or inflammation of the prostate gland. Treatment for this condition depends on the type of prostatitis. Take over-the-counter and prescription medicines only as told by your health care provider. Get help right away of you have chills, feel light-headed, feel like you may faint, cannot urinate, or have blood or blood clots in your urine. This information is not intended to replace advice given to you by your health care provider. Make sure you discuss any questions you have with your health care provider. Document Revised: 10/12/2022 Document Reviewed: 10/12/2022 Elsevier Patient Education  [place].  Cystoscopy Cystoscopy is a procedure that is used to help diagnose and sometimes treat conditions that affect the lower urinary tract. The lower  urinary tract includes the bladder and the urethra. The urethra is the tube that drains urine from the bladder. Cystoscopy is done using a thin, tube-shaped instrument with a light and camera at the end (cystoscope). The cystoscope may be hard or flexible, depending on the goal of the procedure. The cystoscope is inserted through the urethra, into the bladder. Cystoscopy may be recommended if you have: Urinary tract infections that keep coming back. Blood in the urine (hematuria). An inability to control when you urinate (urinary incontinence) or an overactive bladder. Unusual cells found in a urine sample. A blockage in the urethra, such as a urinary stone. Painful urination. An abnormality in the bladder found during an intravenous pyelogram (IVP) or CT scan. What are the risks? Generally, this is a safe procedure. However, problems may occur, including: Infection. Bleeding.  What happens during the procedure?  You will be given one or more of the following: A medicine to numb the area (local anesthetic). The area around the opening of your urethra will be cleaned. The cystoscope will be passed through your urethra into your bladder. Germ-free (sterile) fluid will flow through the cystoscope to fill your bladder. The fluid will stretch your bladder so that your health care provider can clearly examine your bladder walls. Your doctor will look at the urethra and bladder. The cystoscope will be removed The procedure may vary among health care providers  What can I expect after the procedure? After the procedure, it is common to have: Some soreness or pain in your urethra. Urinary symptoms.  These include: Mild pain or burning when you urinate. Pain should stop within a few minutes after you urinate. This may last for up to a few days after the procedure. A small amount of blood in your urine for several days. Feeling like you need to urinate but producing only a small amount of  urine. Follow these instructions at home: General instructions Return to your normal activities as told by your health care provider.  Drink plenty of fluids after the procedure. Keep all follow-up visits as told by your health care provider. This is important. Contact a health care provider if you: Have pain that gets worse or does not get better with medicine, especially pain when you urinate lasting longer than 72 hours after the procedure. Have trouble urinating. Get help right away if you: Have blood clots in your urine. Have a fever or chills. Are unable to urinate. Summary Cystoscopy is a procedure that is used to help diagnose and sometimes treat conditions that affect the lower urinary tract. Cystoscopy is done using a thin, tube-shaped instrument with a light and camera at the end. After the procedure, it is common to have some soreness or pain in your urethra. It is normal to have blood in your urine after the procedure.  If you were prescribed an antibiotic medicine, take it as told by your health care provider.  This information is not intended to replace advice given to you by your health care provider. Make sure you discuss any questions you have with your health care provider. Document Revised: 11/19/2018 Document Reviewed: 11/19/2018 Elsevier Patient Education  [place].

## 2023-03-30 ENCOUNTER — Other Ambulatory Visit: Payer: Self-pay

## 2023-03-30 DIAGNOSIS — R399 Unspecified symptoms and signs involving the genitourinary system: Secondary | ICD-10-CM

## 2023-04-01 NOTE — Progress Notes (Unsigned)
04/02/2023 9:22 AM   Lenard Galloway Levander Campion 19-Feb-1946 [id number]  Referring provider: Rayetta Humphrey, MD [address] [place],  [place] [address]  Urological history: 1.  High risk hematuria -former smoker -CTU (02/2023) - large posterior bladder diverticulum  -return appointment schedule w/ Dr. Richardo Hanks on 04/30  2. BPH w/LU TS -PSA (06/2019) 2.17 -CTU (02/2023) -mild prostatomegaly w/ median lobe and a large posterior bladder diverticulum  No chief complaint on file.   HPI: Marcus Kerr is a 77 y.o. male who presents today for UTI.  Previous records reviewed.   He was initially seen by Dr. Richardo Hanks in March for symptoms of dysuria and pelvic discomfort for the last 2 months.  He was also noted that he had multiple urine samples which demonstrated both microscopic hematuria and pyuria, but urine cultures were negative.  He had a CT urogram which demonstrated a large posterior bladder diverticula in the median prostate lobe.  He was placed on 2 weeks of doxycycline and scheduled to return next week for a repeat urinalysis, PVR and symptom recheck and likely a cystoscopy.  UA ***  PVR ***  PMH: Past Medical History:  Diagnosis Date   Aortic stenosis    a).) stress echo 02/09/2015: EF 55%, mild LVH, mild MR/TR, mod AR, mild AS; b.) TTE 04/13/2022: EF >55%, GLS -19.7%, mild LAE/RVE, triv MR/TR/PR, mild AR, mod AS (MPG 23)   Atherosclerosis of both iliac arteries    Bilateral inguinal hernia (BIH)    a.) s/p RIGHT repair 08/21/2017   BPH (benign prostatic hyperplasia)    Cardiac murmur    HTN (hypertension)    Hyperlipidemia    Occasional use of marijuana     Surgical History: Past Surgical History:  Procedure Laterality Date   COLONOSCOPY     INGUINAL HERNIA REPAIR Right 08/21/2017   Procedure: HERNIA REPAIR INGUINAL ADULT;  Surgeon: Nadeen Landau, MD;  Location: [facility] ORS;  Service: General;  Laterality: Right;   XI ROBOTIC ASSISTED INGUINAL HERNIA REPAIR  WITH MESH Left 01/24/2023   Procedure: XI ROBOTIC ASSISTED INGUINAL HERNIA REPAIR WITH MESH;  Surgeon: Carolan Shiver, MD;  Location: [facility] ORS;  Service: General;  Laterality: Left;    Home Medications:  Allergies as of 04/02/2023   No Known Allergies      Medication List        Accurate as of April 01, 2023  9:22 AM. If you have any questions, ask your nurse or doctor.          amLODipine 10 MG tablet Commonly known as: NORVASC Take 10 mg by mouth every morning.   doxycycline 100 MG capsule Commonly known as: VIBRAMYCIN Take 1 capsule (100 mg total) by mouth every 12 (twelve) hours.   tamsulosin 0.4 MG Caps capsule Commonly known as: FLOMAX Take 0.4 mg by mouth daily after breakfast.   VITAMIN A PO Take 1 tablet by mouth daily at 6 (six) AM.   VITAMIN C PO Take 1 tablet by mouth daily at 6 (six) AM.   VITAMIN D PO Take 1 tablet by mouth daily.   VITAMIN E PO Take 1 tablet by mouth daily at 6 (six) AM.        Allergies: No Known Allergies  Family History: No family history on file.  Social History:  reports that he quit smoking about 46 years ago. His smoking use included cigarettes. He has never used smokeless tobacco. He reports current alcohol use of about 1.0 standard  drink of alcohol per week. He reports current drug use. Drug: Marijuana.  ROS: Pertinent ROS in HPI  Physical Exam: There were no vitals taken for this visit.  Constitutional:  Well nourished. Alert and oriented, No acute distress. HEENT: [place] AT, moist mucus membranes.  Trachea midline, no masses. Cardiovascular: No clubbing, cyanosis, or edema. Respiratory: Normal respiratory effort, no increased work of breathing. GI: Abdomen is soft, non tender, non distended, no abdominal masses. Liver and spleen not palpable.  No hernias appreciated.  Stool sample for occult testing is not indicated.   GU: No CVA tenderness.  No bladder fullness or masses.  Patient with  circumcised/uncircumcised phallus. ***Foreskin easily retracted***  Urethral meatus is patent.  No penile discharge. No penile lesions or rashes. Scrotum without lesions, cysts, rashes and/or edema.  Testicles are located scrotally bilaterally. No masses are appreciated in the testicles. Left and right epididymis are normal. Rectal: Patient with  normal sphincter tone. Anus and perineum without scarring or rashes. No rectal masses are appreciated. Prostate is approximately *** grams, *** nodules are appreciated. Seminal vesicles are normal. Skin: No rashes, bruises or suspicious lesions. Lymph: No cervical or inguinal adenopathy. Neurologic: Grossly intact, no focal deficits, moving all 4 extremities. Psychiatric: Normal mood and affect.  Laboratory Data: Lab Results  Component Value Date   WBC 6.6 01/18/2023   HGB 13.8 01/18/2023   HCT 43.1 01/18/2023   MCV 82.1 01/18/2023   PLT 166 01/18/2023    Lab Results  Component Value Date   CREATININE 1.06 01/18/2023   Urinalysis *** I have reviewed the labs.   Pertinent Imaging: ***   Assessment & Plan:  ***  1. Dysuria -UA *** -urine sent for culture -He has been having persistent symptoms for the last few months with negative cultures -We will continue the doxycycline 100 mg twice daily to cover him until he has an appointment with Dr. Richardo Hanks next week for possible cystoscopy -Explained that the cystoscopy is a safe and common diagnostic test performed by Dr. Richardo Hanks in the office.  It consist of using a thin, lighted tube to look directly inside the bladder, prostate and urethra to evaluate the anatomy.  The procedure is brief, typically taking about 5 minutes. -This will unable [place] to assess bladder health, diagnose and enlarged prostate, assess which BPH procedure may be most appropriate and rule out other bladder conditions (stricture disease, stones, cancer, etc.)  -Advised the patient that there are no restrictions to eating  or drinking prior to the cystoscopy -They can continue to take all of their medications as prescribed -They can drive themselves to and from the appointment -I explained that during the procedure, the area around the urethra will be cleansed thoroughly, topical anesthetic will be applied to numb your urethra, the thin tube is then gently inserted through the urethra into your bladder while fluid flows through the tube to the bladder to enable better visualization -I explained the procedure is usually not painful, however there may be some discomfort (pinching feeling), and they may feel an urge to urinate, coolness or fullness in the bladder and then the cystoscope is removed -After the cystoscopy, I advised them that they may experience urinary frequency, and infection, hematuria, dysuria which will resolve within 24 to 48 hours -Reviewed red flag signs (fever, bright red blood or blood clots in the urine, abdominal pain or difficulty urinating) and to contact the office immediately or seek treatment in the ED if they should experience any of these -  The physician will discuss the results of the cystoscopy at the time of the procedure   2. Microscopic hematuria -Former smoker -CT urogram completed in March identified a large bladder diverticulum and mild prostamegaly with a median lobe -He is scheduled to return with Dr. Richardo Hanks on April 30 where he will likely complete a cystoscopy  3. BPH with LU TS -Continue tamsulosin 0.4 mg daily  No follow-ups on file.  These notes generated with voice recognition software. I apologize for typographical errors.  Cloretta Ned  [facility] [address]  [address] [place], [place] [address] [phone]

## 2023-04-02 ENCOUNTER — Other Ambulatory Visit
Admission: RE | Admit: 2023-04-02 | Discharge: 2023-04-02 | Disposition: A | Discharge status: Home / Self Care | Payer: PPO | Attending: Urology | Admitting: Urology

## 2023-04-02 ENCOUNTER — Ambulatory Visit (INDEPENDENT_AMBULATORY_CARE_PROVIDER_SITE_OTHER): Payer: PPO | Admitting: Urology

## 2023-04-02 ENCOUNTER — Encounter: Payer: Self-pay | Admitting: Urology

## 2023-04-02 VITALS — BP 137/74 | HR 88 | Temp 97.4°F | Ht 67.0 in | Wt 156.0 lb

## 2023-04-02 DIAGNOSIS — R3129 Other microscopic hematuria: Secondary | ICD-10-CM

## 2023-04-02 DIAGNOSIS — R300 Dysuria: Secondary | ICD-10-CM | POA: Diagnosis not present

## 2023-04-02 DIAGNOSIS — N401 Enlarged prostate with lower urinary tract symptoms: Secondary | ICD-10-CM

## 2023-04-02 DIAGNOSIS — R399 Unspecified symptoms and signs involving the genitourinary system: Secondary | ICD-10-CM | POA: Diagnosis present

## 2023-04-02 DIAGNOSIS — N138 Other obstructive and reflux uropathy: Secondary | ICD-10-CM

## 2023-04-02 LAB — URINALYSIS, COMPLETE (UACMP) WITH MICROSCOPIC
Bilirubin Urine: NEGATIVE
Glucose, UA: NEGATIVE mg/dL
Ketones, ur: NEGATIVE mg/dL
Nitrite: NEGATIVE
Protein, ur: 100 mg/dL — AB
Specific Gravity, Urine: 1.025 (ref 1.005–1.030)
Squamous Epithelial / HPF: NONE SEEN /HPF (ref 0–5)
pH: 6 (ref 5.0–8.0)

## 2023-04-02 LAB — BLADDER SCAN AMB NON-IMAGING

## 2023-04-03 LAB — URINE CULTURE: Culture: NO GROWTH

## 2023-04-10 ENCOUNTER — Encounter: Payer: Self-pay | Admitting: Urology

## 2023-04-10 ENCOUNTER — Other Ambulatory Visit
Admission: RE | Admit: 2023-04-10 | Discharge: 2023-04-10 | Disposition: A | Discharge status: Home / Self Care | Payer: PPO | Attending: Urology | Admitting: Urology

## 2023-04-10 ENCOUNTER — Other Ambulatory Visit: Payer: Self-pay | Admitting: *Deleted

## 2023-04-10 ENCOUNTER — Ambulatory Visit (INDEPENDENT_AMBULATORY_CARE_PROVIDER_SITE_OTHER): Payer: PPO | Admitting: Urology

## 2023-04-10 VITALS — BP 138/68 | HR 54 | Ht 67.0 in | Wt 156.4 lb

## 2023-04-10 DIAGNOSIS — R300 Dysuria: Secondary | ICD-10-CM | POA: Diagnosis present

## 2023-04-10 DIAGNOSIS — R3129 Other microscopic hematuria: Secondary | ICD-10-CM | POA: Diagnosis present

## 2023-04-10 DIAGNOSIS — R8281 Pyuria: Secondary | ICD-10-CM

## 2023-04-10 LAB — BLADDER SCAN AMB NON-IMAGING

## 2023-04-10 MED ORDER — SULFAMETHOXAZOLE-TRIMETHOPRIM 800-160 MG PO TABS
1.0000 | ORAL_TABLET | Freq: Once | ORAL | Status: AC
  Administered 2023-04-10: 1 via ORAL

## 2023-04-10 MED ORDER — LIDOCAINE HCL URETHRAL/MUCOSAL 2 % EX GEL
1.0000 | Freq: Once | CUTANEOUS | Status: AC
  Administered 2023-04-10: 1 via URETHRAL

## 2023-04-10 MED ORDER — SULFAMETHOXAZOLE-TRIMETHOPRIM 800-160 MG PO TABS: 1.0000 | ORAL_TABLET | Freq: Two times a day (BID) | ORAL | 0 refills | Status: DC

## 2023-04-10 NOTE — Progress Notes (Signed)
Cystoscopy Procedure Note:  Indication: Microscopic hematuria, pyuria, dysuria, negative culture  After informed consent and discussion of the procedure and its risks, Marcus Kerr was positioned and prepped in the standard fashion. Cystoscopy was performed with a flexible cystoscope. The urethra, bladder neck and entire bladder was visualized in a standard fashion. The prostate was moderate in size. The ureteral orifices were visualized in their normal location and orientation.  Mild trabeculations but no suspicious lesions, retroflexion showing small diverticular opening near the bladder neck, able to access diverticulum and no suspicious lesions within.  Imaging: CT abdomen with and without contrast with posterior bladder wall diverticulum, no other urologic abnormalities  Findings: Normal bladder mucosa, moderate bladder diverticulum with no abnormalities within  Assessment and Plan: He had some initial improvement after 2 weeks of doxycycline, but dysuria and some mild pelvic pain has resolved.  Symptoms most consistent with prostatitis, recommended a 3-week course of Bactrim DS twice daily, will repeat urine culture today.  PVR today , challenging to get exact measurement with the known diverticulum, likely has a component of incomplete emptying, potentially from BPH in addition to the diverticulum.  Bactrim DS twice daily x 3 weeks Follow-up cultures RTC 4 to 6 weeks repeat PVR and symptom check-> could consider HOLEP in the future if persistently elevated PVRs, urinary symptoms, or recurrent infections  Legrand Rams, MD 04/10/2023

## 2023-04-11 LAB — URINE CULTURE: Culture: NO GROWTH

## 2023-05-09 ENCOUNTER — Other Ambulatory Visit: Payer: Self-pay | Admitting: Urology

## 2023-05-15 ENCOUNTER — Encounter: Payer: Self-pay | Admitting: Urology

## 2023-05-15 ENCOUNTER — Ambulatory Visit (INDEPENDENT_AMBULATORY_CARE_PROVIDER_SITE_OTHER): Payer: PPO | Admitting: Urology

## 2023-05-15 VITALS — BP 122/72 | HR 78 | Ht 67.0 in | Wt 152.0 lb

## 2023-05-15 DIAGNOSIS — N323 Diverticulum of bladder: Secondary | ICD-10-CM | POA: Diagnosis not present

## 2023-05-15 DIAGNOSIS — N419 Inflammatory disease of prostate, unspecified: Secondary | ICD-10-CM

## 2023-05-15 DIAGNOSIS — R3121 Asymptomatic microscopic hematuria: Secondary | ICD-10-CM

## 2023-05-15 DIAGNOSIS — R339 Retention of urine, unspecified: Secondary | ICD-10-CM

## 2023-05-15 DIAGNOSIS — N401 Enlarged prostate with lower urinary tract symptoms: Secondary | ICD-10-CM

## 2023-05-15 LAB — BLADDER SCAN AMB NON-IMAGING

## 2023-05-15 MED ORDER — TAMSULOSIN HCL 0.4 MG PO CAPS: 0.4000 mg | ORAL_CAPSULE | Freq: Every day | ORAL | 11 refills | Status: DC

## 2023-05-15 NOTE — Progress Notes (Signed)
   05/15/2023 1:29 PM   Marcus Kerr 1946/01/03 [id number]  Reason for visit: Follow up urinary symptoms, microscopic hematuria, dysuria, pelvic pain  HPI: 77 year old male who I originally saw in March 2024 for weak urinary stream, dysuria, pelvic discomfort.  Multiple urine sample showed microscopic hematuria and pyuria but cultures were negative.  CT urogram ordered by PCP showed a large posterior bladder diverticulum but no other abnormalities.  He also had a hernia repair with general surgery in February 2024.  PSA was normal at 2.17 in 2020, PSA screening discontinued per guideline recommendations at that time.  He underwent cystoscopy on 04/10/2023 that showed a normal-appearing bladder with a small diverticular opening, no suspicious lesions within the diverticulum.  Prostate measured 50 g on CT.  His clinical picture was most consistent with prostatitis, and he was ultimately treated with a 3-week course of Bactrim, in addition to Flomax that was prescribed by PCP.  He thinks the antibiotics have significantly improved his symptoms.  He really denies any significant problems today, aside from occasional urgency, and nocturia twice per night.  PVR today mildly elevated at , likely related to incomplete emptying of his bladder diverticulum.  We discussed other options like HOLEP for outlet procedure to see if we can improve his incomplete emptying and some of his urinary symptoms.  He would like to avoid surgery at this time and stick with the Flomax.  We discussed return precautions extensively including recurrent UTIs/prostatitis, or worsening urinary symptoms, or urinary retention.  Flomax refilled RTC 6 months PVR, consider HOLEP in the future if worsening urinary symptoms/recurrent UTI/retention  Sondra Come, MD  [facility] [address] [place], [place] [address] [phone]

## 2023-11-14 ENCOUNTER — Ambulatory Visit: Payer: PPO | Admitting: Urology

## 2023-11-27 ENCOUNTER — Ambulatory Visit: Payer: PPO | Admitting: Urology

## 2023-12-19 ENCOUNTER — Ambulatory Visit: Payer: PPO | Admitting: Urology

## 2023-12-19 VITALS — BP 123/68 | HR 77 | Ht 67.0 in | Wt 154.0 lb

## 2023-12-19 DIAGNOSIS — N323 Diverticulum of bladder: Secondary | ICD-10-CM | POA: Diagnosis not present

## 2023-12-19 DIAGNOSIS — N401 Enlarged prostate with lower urinary tract symptoms: Secondary | ICD-10-CM

## 2023-12-19 DIAGNOSIS — R102 Pelvic and perineal pain: Secondary | ICD-10-CM | POA: Diagnosis not present

## 2023-12-19 DIAGNOSIS — N138 Other obstructive and reflux uropathy: Secondary | ICD-10-CM

## 2023-12-19 LAB — BLADDER SCAN AMB NON-IMAGING

## 2023-12-19 MED ORDER — GABAPENTIN 300 MG PO CAPS: 600.0000 mg | ORAL_CAPSULE | Freq: Two times a day (BID) | ORAL | 8 refills | Status: AC

## 2023-12-19 MED ORDER — TAMSULOSIN HCL 0.4 MG PO CAPS: 0.4000 mg | ORAL_CAPSULE | Freq: Every day | ORAL | 11 refills | Status: DC

## 2023-12-19 NOTE — Progress Notes (Signed)
   12/19/2023 4:35 PM   Marcus Kerr 01-05-1946 [id number]  Reason for visit: Follow up urinary symptoms, microscopic hematuria, dysuria, pelvic pain  HPI: 78 year old male who I originally saw in March 2024 for weak urinary stream, dysuria, pelvic discomfort.  Multiple urine sample showed microscopic hematuria and pyuria but cultures were negative.  CT urogram ordered by PCP showed a large posterior bladder diverticulum but no other abnormalities.  He also had a hernia repair with general surgery in February 2024.  PSA was normal at 2.17 in 2020, PSA screening discontinued per guideline recommendations at that time.He underwent cystoscopy on 04/10/2023 that showed a normal-appearing bladder with a small diverticular opening, no suspicious lesions within the diverticulum.  Prostate measured 50 g on CT.His clinical picture was most consistent with prostatitis, and he was ultimately treated with a 3-week course of Bactrim , in addition to Flomax  that was prescribed by PCP.  He had significant improvement after antibiotics.  PVRs have been stable around consistent with incomplete emptying of his diverticulumWe discussed other options like HOLEP for outlet procedure to see if we can improve his incomplete emptying and some of his urinary symptoms.  He would like to avoid surgery at this time and stick with the Flomax .  We discussed return precautions extensively including recurrent UTIs/prostatitis, or worsening urinary symptoms, or urinary retention.  He denies any major changes over the last 6 months.  He occasionally has some lower abdominal and groin discomfort of unclear etiology.  Really denies any urinary symptoms, no dysuria, gross hematuria.  He was previously prescribed gabapentin  by his general surgeon which improved his discomfort.  He is unable to identify any specific foods or behaviors that exacerbate his groin or lower abdominal pain.  I think would be reasonable to increase the  gabapentin  dose which has helped previously, and low suspicion for urologic cause of his lower abdominal and groin discomfort that do not seem to be related to urination.  Flomax  refilled Gabapentin  dose increased RTC 1 year PVR, sooner if problems  Marcus JAYSON Burnet, MD  [facility] [address], [address] [place], [place] [address] [phone]

## 2024-06-25 ENCOUNTER — Encounter: Payer: Self-pay | Admitting: Urology

## 2024-08-19 NOTE — Progress Notes (Signed)
 Chief Complaint  Patient presents with  . Annual Exam    Subjective:   Marcus Kerr is a 78 y.o. male here for a health maintenance visit.  Patient is an established patient Additional concerns addressed today:  History of Present Illness    Essential hypertension He has essential hypertension. Denies having headaches, chest pain, palpitations, or edema  Hypercholesterolemia He has hypercholesterolemia. Has been taking a statin. Has been consuming a low cholesterol diet along with exercise  Urinary problem in male He has prostatic hypertrophy for which he uses flomax . Has not had increased urinary frequency since being on flomax . Sometimes, he can feel pressure if his bladder gets too full and has to urinate which alleviates his concerns.    HPI   Patient Active Problem List  Diagnosis  . Tests: Colonoscopy 2015 WNL  . Essential hypertension  . Mixed hyperlipidemia  . Hypercholesterolemia  . Left tennis elbow  . Right inguinal hernia  . Moderate aortic valve stenosis    Outpatient Medications Prior to Visit  Medication Sig Dispense Refill  . amLODIPine (NORVASC) 10 MG tablet Take 1 tablet by mouth once daily 100 tablet 3  . aspirin 81 MG EC tablet Take 81 mg by mouth once daily.    SABRA latanoprost (XALATAN) 0.005 % ophthalmic solution     . rosuvastatin (CRESTOR) 5 MG tablet Take 1 tablet by mouth once daily 100 tablet 1  . tamsulosin  (FLOMAX ) 0.4 mg capsule Take 1 capsule (0.4 mg total) by mouth once daily 30 MINUTES AFTER THE SAME MEAL EACH DAY 90 capsule 3  . gabapentin  (NEURONTIN ) 300 MG capsule Take 1 capsule (300 mg total) by mouth 3 (three) times daily 90 capsule 11   No facility-administered medications prior to visit.    Social Drivers of Health with Concerns   Tobacco Use: Medium Risk (08/19/2024)   Patient History   . Smoking Tobacco Use: Former   . Smokeless Tobacco Use: Never  Housing Stability: High Risk (02/20/2024)   Housing Stability Vital Sign    . Unable to Pay for Housing in the Last Year: Yes   . Homeless in the Last Year: No  Utilities: At Risk (08/17/2023)   [employer] Utilities   . Threatened with loss of utilities: Yes      08/17/2023  [employer] Authorization for Release of Information - [employer]   Are any of your needs urgent? No  Would you like help with any of the needs that you have identified? No *  Permission to provide our community partners with your name, address, and phone number so they can contact you Accept  Signature of Patient / Personal Representative [email or web address]  Date 08/17/2023  Phone Number:  [phone]    * Data saved with a previous flowsheet row definition    Social History   Tobacco Use  . Smoking status: Former    Current packs/day: 0.00    Average packs/day: 0.5 packs/day for 3.0 years (1.5 ttl pk-yrs)    Types: Cigarettes    Start date: 12/11/1973    Quit date: 12/11/1976    Years since quitting: 47.7  . Smokeless tobacco: Never  Vaping Use  . Vaping status: Never Used  Substance Use Topics  . Alcohol use: No    Alcohol/week: 0.0 standard drinks of alcohol  . Drug use: Not Currently    Types: Other-see comments    Comment: Occasional marijuana use   Other Health Habits: Exercise: 3-4 times/week on average Current exercise activities: walking Diet: well  balanced Dental Exam: up to date Eye Exam: up to date  Sexual Health Sexually active: Yes with male partner Current contraception: none  Health Maintenance: See under health Maintenance activity for review of completion dates as well. Immunization History  Administered Date(s) Administered  . COVID-19 Moderna Vaccine (1st,2nd,3rd dose = 0.66ml) 02/21/2020, 03/20/2020, 12/21/2020  . Flu Vaccine HD-IIV3 High Dose, IM PF(77yo+)(Fluzone) 08/22/2016  . Flu Vaccine IIV3, IM PF (18mo+)(Fluarix, FluLaval, Fluzone) 08/11/2016  . Influenza IIV4, IM pres-free 09/15/2014, 08/19/2015  . Influenza, IM unspecified 09/15/2014, 08/19/2015, 08/25/2016   . PNEUMOCOCCAL (PCV13) (BIRTH-73YR) VACCINE (PREVNAR 13) 05/06/2014, 08/22/2016  . PNEUMOCOCCAL (PPSV23)(>=78YRS -OR- >=2 YRS WITH RISK) VACCINE (PNEUMOVAX 23) 12/25/2014  . Varicella zoster (Zostavax) 11/11/2015, 12/02/2015    Health Maintenance Topics with due status: Overdue     Topic Date Due   Annual Physical/Well Child Check 05/04/2022   Creatinine Level 01/19/2024   Potassium Level 01/19/2024   Serum Calcium 01/19/2024   Lipid Panel 02/14/2024   Colorectal Cancer Screening 03/16/2024   Health Maintenance Topics with due status: Due On     Topic Date Due   Medicare Subsequent AWV [id number] 08/17/2024   Health Maintenance Topics with due status: Postponed     Topic Postponed Until   RSV Immunization Pregnant or 60+ 11/19/2024 (Originally 06/15/2022)   Health Maintenance Topics with due status: Not Due     Topic Last Completion Date   Depression Screening 08/19/2024   Health Maintenance Topics with due status: Completed     Topic Last Completion Date   Hepatitis C Screen 05/06/2014   AAA Screen 09/30/2014   Pneumococcal Vaccine: 50+ 08/22/2016   Health Maintenance Topics with due status: Aged Out     Topic Date Due   Hib Vaccines Aged Out   Hepatitis A Vaccines Aged Out   Meningococcal B Vaccine Aged Out   Meningococcal ACWY Vaccine Aged Out   HPV Vaccines Aged Out   Health Maintenance Topics with due status: Discontinued     Topic Date Due   COVID-19 Vaccine Discontinued   Adult Tetanus (Td And Tdap) Discontinued   Shingrix Discontinued   Influenza Vaccine Discontinued    Depression Screen-PHQ2/9 PHQ-2 PHQ-2 Over the last 2 weeks, how often have you been bothered by any of the following problems? Little interest or pleasure in doing things: (Patient-Rptd) Not at all Feeling down, depressed, or hopeless: (Patient-Rptd) Not at all Patient Health Questionnaire-2 Score: (Patient-Rptd) 0  PHQ-9 (if PHQ >=3)    PHQ-2 Interpretation Values between 0-3 are  considered not significant for depression  PHQ-9 Interpretation and Treatment Recommendations:  0-4= None  5-9= Mild / Treatment: Support, educate to call if worse; return in one month  10-14= Moderate / Treatment: Support, watchful waiting; Antidepressant or Psychotherapy  15-19= Moderately severe / Treatment: Antidepressant OR Psychotherapy  >= 20 = Major depression, severe / Antidepressant AND Psychotherapy  Review of Systems  Objective:   Vitals:   08/19/24 1000  BP: 116/64  Pulse: 65  Resp: 16  Weight: 70.6 kg (155 lb 10.3 oz)   Body mass index is 24.37 kg/m. Home vitals:     Physical Exam  Physical Exam Constitutional: alert and in NAD Conversation: normal and appropriate HEENT: EOMI, external ear canals normal, tympanic membranes normal, and pupils equal and round Skin: normal Oropharynx: mucous membranes moist Neck: supple and no thyroid enlargement or cervical adenopathy Respiratory: clear to auscultation, without rales or wheezes Cardiovascular: regular rate and rhythm and without murmurs, rubs or gallops  Abdomen: soft, nontender, nondistended, and no hepatosplenomegaly Musculoskeletal: age appropriate ROM of shoulders, hips and knees Extremities: no lower extremity edema, dorsalis pedis pulses normal Neurological: grossly intact and normal gait  Genitalia: exam deferred Rectal: exam deferred  Results    Assessment/Plan:  Diagnoses and all orders for this visit:  Routine general medical examination at health care facility  Depression screening (Z13.31) -     Depression Screen -(PHQ- 2/9, BDI)  Encounter for behavioral health screening (Z13.30) -     Anxiety Screen [[id number]] Patient was seen for a health maintenance exam.  Counseled the patient on health maintenance issues. Reviewed his health maintenance schedule and ordered appropriate tests. Counseled on regular exercise and weight management. Recommend regular eye exams and dental cleaning.    The following issues were addressed today for health maintenance: Colon cancer screening options reviewed Diet - reviewed healthy diet and weight management Diet - discussed need for weight loss and plans for achieving this Discussed the importance of aerobic exercise and strength training Encouraged to schedule routine dental follow up Encouraged to schedule routine eye check Follow up for annual exam in one year Prostate cancer screening recommendations reviewed Recommended tobacco cessation Recommended sunscreen usage Recommended wearing seat belts Recommended monthy testicular exam  The patient's BMI is above the acceptable range; discussed or provided materials on diet/exercise  Assessment & Plan   Essential hypertension -     Comprehensive Metabolic Panel (CMP); Future -     Complete Blood Count (CBC); Future Well controlled  -  Continue current medication(s)-Amlodipine 10mg  po qd -  Encouraged dietary sodium restriction/DASH diet -  Recommended exercise -  Recommended regular aerobic exercise. -  Recommend home blood pressure monitoring, to bring results in next visit -  Discussed need for and importance of continued work on weight loss -  Discussed need for med compliance -  Reviewed risks of HTN, principles of treatment and consequences of untreated HTN - Goal blood pressure less than 14-/90 -  Follow up in6 month for BP recheck and follow up   Hypercholesterolemia -     Lipid Panel W/Reflex Direct Low Density Lipoprotein (LDL) Cholesterol; Future - Continue current medicartions -  Reviewed goal for LDL of less than 130 -  Encouraged to follow a low fat, low cholesterol diet -  Discussed the benefits of regular aerobic exercise -  Encouraged weight loss  Urinary problem in male -     [facility] POC Urinalysis Chemical w/Option to Reflex Urine Culture; Future -     CULTURE, URINE, REFLEXED FROM PYURIA SCREEN Rtc, prn    This visit was coded based on medical  decision making (MDM).           Future Appointments     Date/Time Provider Department Center Visit Type   11/19/2024 9:30 AM Mevelyn Romero Pander, PA [facility] C NEW PATIENT   02/17/2025 11:30 AM (Arrive by 11:15 AM) Zachary Idelia Sherita Jock, MD [facility] OFFICE VISIT   08/19/2025 10:30 AM (Arrive by 10:15 AM) Zachary Idelia Sherita Jock, MD [facility] PHYSICAL       An after visit summary was provided for the patient either in written format or through MyChart  This note has been created using automated tools and reviewed for accuracy by [facility] CID Calhan. This visit was coded based on medical decision making (MDM).    Portions of this note were generated with voice recognition software (  DragonMedicalOne dictation software / speech recognition software/[employer]) and may contain unintended transcription errors as interpreted by the software. I apologize for any  typographical errors that were not detected and corrected.       *Some images could not be shown.

## 2024-09-16 ENCOUNTER — Ambulatory Visit: Admitting: Urology

## 2024-09-16 VITALS — BP 136/73 | HR 64

## 2024-09-16 DIAGNOSIS — N401 Enlarged prostate with lower urinary tract symptoms: Secondary | ICD-10-CM

## 2024-09-16 DIAGNOSIS — N138 Other obstructive and reflux uropathy: Secondary | ICD-10-CM | POA: Diagnosis not present

## 2024-09-16 DIAGNOSIS — N323 Diverticulum of bladder: Secondary | ICD-10-CM

## 2024-09-16 DIAGNOSIS — R102 Pelvic and perineal pain unspecified side: Secondary | ICD-10-CM

## 2024-09-16 LAB — BLADDER SCAN AMB NON-IMAGING

## 2024-09-16 MED ORDER — TAMSULOSIN HCL 0.4 MG PO CAPS: 0.4000 mg | ORAL_CAPSULE | Freq: Every day | ORAL | 11 refills | Status: AC

## 2024-09-16 NOTE — Progress Notes (Signed)
   09/16/2024 2:35 PM   Marcus Kerr 03-31-1946 [id number]  Reason for visit: Follow up BPH/LUTS, pelvic pain  History: Original visit March 2024 for weak urinary stream and pelvic discomfort Workup with CT urogram and cystoscopy benign aside from bladder diverticulum History of hernia repair General Surgery and intermittent pelvic pain Symptoms improved after a course of Bactrim  Chronic mild PVR elevation around with known diverticulum PSA normal at 2.11 June 2019, screening discontinued per guideline recommendations at that time  Physical Exam: BP 136/73 (BP Location: Left Arm, Patient Position: Sitting, Cuff Size: Normal)   Pulse 64   SpO2 98%   Normal renal function, creatinine 1.2, eGFR greater than 60  Today: No complaints since our last visit, overall doing well.  Really no significant urinary problems.  Nocturia x 1.  Has some occasional rare brief pelvic pain that does not seem to be associated with urination PVR today , but last void was over an hour ago  Plan:   BPH: Continue Flomax , return precautions discussed Pelvic pain: Continue gabapentin  for pelvic pain RTC 1 year PVR   Marcus JAYSON Burnet, MD  [facility] [address], [address] [place], [place] [address] [phone]

## 2024-09-17 ENCOUNTER — Ambulatory Visit: Payer: Self-pay | Admitting: Urology

## 2024-11-24 ENCOUNTER — Encounter: Payer: Self-pay | Admitting: Gastroenterology

## 2024-11-24 ENCOUNTER — Ambulatory Visit
Admission: RE | Admit: 2024-11-24 | Discharge: 2024-11-24 | Disposition: A | Attending: Gastroenterology | Admitting: Gastroenterology

## 2024-11-24 ENCOUNTER — Encounter: Admission: RE | Disposition: A | Payer: Self-pay | Source: Home / Self Care | Attending: Gastroenterology

## 2024-11-24 ENCOUNTER — Ambulatory Visit: Admitting: Anesthesiology

## 2024-11-24 DIAGNOSIS — D122 Benign neoplasm of ascending colon: Secondary | ICD-10-CM | POA: Diagnosis not present

## 2024-11-24 DIAGNOSIS — K635 Polyp of colon: Secondary | ICD-10-CM | POA: Insufficient documentation

## 2024-11-24 DIAGNOSIS — Z79899 Other long term (current) drug therapy: Secondary | ICD-10-CM | POA: Diagnosis not present

## 2024-11-24 DIAGNOSIS — D123 Benign neoplasm of transverse colon: Secondary | ICD-10-CM | POA: Insufficient documentation

## 2024-11-24 DIAGNOSIS — I10 Essential (primary) hypertension: Secondary | ICD-10-CM | POA: Insufficient documentation

## 2024-11-24 DIAGNOSIS — Z1211 Encounter for screening for malignant neoplasm of colon: Secondary | ICD-10-CM | POA: Diagnosis present

## 2024-11-24 DIAGNOSIS — I739 Peripheral vascular disease, unspecified: Secondary | ICD-10-CM | POA: Diagnosis not present

## 2024-11-24 DIAGNOSIS — K640 First degree hemorrhoids: Secondary | ICD-10-CM | POA: Diagnosis not present

## 2024-11-24 DIAGNOSIS — D120 Benign neoplasm of cecum: Secondary | ICD-10-CM | POA: Diagnosis not present

## 2024-11-24 HISTORY — PX: POLYPECTOMY: SHX149

## 2024-11-24 HISTORY — PX: COLONOSCOPY: SHX5424

## 2024-11-24 SURGERY — COLONOSCOPY
Anesthesia: General

## 2024-11-24 MED ORDER — LIDOCAINE HCL (CARDIAC) PF 100 MG/5ML IV SOSY
PREFILLED_SYRINGE | INTRAVENOUS | Status: DC | PRN
  Administered 2024-11-24: 10:00:00 60 mg via INTRAVENOUS

## 2024-11-24 MED ORDER — SODIUM CHLORIDE 0.9 % IV SOLN
INTRAVENOUS | Status: DC
  Administered 2024-11-24: 09:00:00 500 mL via INTRAVENOUS

## 2024-11-24 MED ORDER — PROPOFOL 500 MG/50ML IV EMUL
INTRAVENOUS | Status: DC | PRN
  Administered 2024-11-24: 10:00:00 75 ug/kg/min via INTRAVENOUS

## 2024-11-24 MED ORDER — PROPOFOL 10 MG/ML IV BOLUS
INTRAVENOUS | Status: DC | PRN
  Administered 2024-11-24: 10:00:00 40 mg via INTRAVENOUS
  Administered 2024-11-24: 10:00:00 20 mg via INTRAVENOUS

## 2024-11-24 MED ORDER — PHENYLEPHRINE 80 MCG/ML (10ML) SYRINGE FOR IV PUSH (FOR BLOOD PRESSURE SUPPORT)
PREFILLED_SYRINGE | INTRAVENOUS | Status: DC | PRN
  Administered 2024-11-24 (×2): 160 ug via INTRAVENOUS

## 2024-11-24 MED ORDER — DEXMEDETOMIDINE HCL IN NACL 80 MCG/20ML IV SOLN
INTRAVENOUS | Status: DC | PRN
  Administered 2024-11-24: 10:00:00 12 ug via INTRAVENOUS
  Administered 2024-11-24: 10:00:00 8 ug via INTRAVENOUS

## 2024-11-24 MED ORDER — LIDOCAINE HCL (PF) 2 % IJ SOLN
INTRAMUSCULAR | Status: AC
  Filled 2024-11-24: qty 5

## 2024-11-24 MED ORDER — EPHEDRINE SULFATE-NACL 50-0.9 MG/10ML-% IV SOSY
PREFILLED_SYRINGE | INTRAVENOUS | Status: DC | PRN
  Administered 2024-11-24 (×2): 10 mg via INTRAVENOUS

## 2024-11-24 MED ORDER — DEXMEDETOMIDINE HCL IN NACL 80 MCG/20ML IV SOLN: INTRAVENOUS | Status: DC | PRN

## 2024-11-24 NOTE — Interval H&P Note (Signed)
 History and Physical Interval Note: Preprocedure H&P from 11/24/2024  was reviewed and there was no interval change after seeing and examining the patient.  Written consent was obtained from the patient after discussion of risks, benefits, and alternatives. Patient has consented to proceed with Colonoscopy with possible intervention   11/24/2024 9:39 AM  Marcus Kerr  has presented today for surgery, with the diagnosis of Colon cancer screening (Z12.11).  The various methods of treatment have been discussed with the patient and family. After consideration of risks, benefits and other options for treatment, the patient has consented to  Procedures: COLONOSCOPY (N/A) as a surgical intervention.  The patient's history has been reviewed, patient examined, no change in status, stable for surgery.  I have reviewed the patient's chart and labs.  Questions were answered to the patient's satisfaction.     Elspeth Ozell Jungling

## 2024-11-24 NOTE — Transfer of Care (Signed)
 Immediate Anesthesia Transfer of Care Note  Patient: Marcus Kerr  Procedure(s) Performed: COLONOSCOPY POLYPECTOMY, INTESTINE  Patient Location: PACU  Anesthesia Type:General  Level of Consciousness: sedated  Airway & Oxygen Therapy: Patient Spontanous Breathing  Post-op Assessment: Report given to RN and Post -op Vital signs reviewed and stable  Post vital signs: Reviewed and stable  Last Vitals:  Vitals Value Taken Time  BP 99/62 11/24/24 10:07  Temp 36.2 C 11/24/24 10:06  Pulse 64 11/24/24 10:08  Resp 17 11/24/24 10:08  SpO2 98 % 11/24/24 10:08  Vitals shown include unfiled device data.  Last Pain:  Vitals:   11/24/24 1006  TempSrc: Tympanic  PainSc: Asleep         Complications: No notable events documented.

## 2024-11-24 NOTE — H&P (Signed)
 Pre-Procedure H&P   Patient ID: Marcus Kerr is a 78 y.o. male.  Gastroenterology Provider: Elspeth Ozell Jungling, DO  Referring Provider: Romero [place], PA PCP: Zachary Idelia LABOR, MD  Date: 11/24/2024  HPI Mr. Marcus Kerr is a 78 y.o. male who presents today for Colonoscopy for colorectal cancer screening.  Last csy 2015- normal. No gi sx. No fhx crc or colon polyps   Past Medical History:  Diagnosis Date   Aortic stenosis    a).) stress echo 02/09/2015: EF 55%, mild LVH, mild MR/TR, mod AR, mild AS; b.) TTE 04/13/2022: EF >55%, GLS -19.7%, mild LAE/RVE, triv MR/TR/PR, mild AR, mod AS (MPG 23)   Atherosclerosis of both iliac arteries    Bilateral inguinal hernia (BIH)    a.) s/p RIGHT repair 08/21/2017   BPH (benign prostatic hyperplasia)    Cardiac murmur    HTN (hypertension)    Hyperlipidemia    Occasional use of marijuana     Past Surgical History:  Procedure Laterality Date   COLONOSCOPY     HERNIA REPAIR     INGUINAL HERNIA REPAIR Right 08/21/2017   Procedure: HERNIA REPAIR INGUINAL ADULT;  Surgeon: Claudene Larinda Bolder, MD;  Location: [facility] ORS;  Service: General;  Laterality: Right;   XI ROBOTIC ASSISTED INGUINAL HERNIA REPAIR WITH MESH Left 01/24/2023   Procedure: XI ROBOTIC ASSISTED INGUINAL HERNIA REPAIR WITH MESH;  Surgeon: Rodolph Romano, MD;  Location: [facility] ORS;  Service: General;  Laterality: Left;    Family History No h/o GI disease or malignancy  Review of Systems  Constitutional:  Negative for activity change, appetite change, chills, diaphoresis, fatigue, fever and unexpected weight change.  HENT:  Negative for trouble swallowing and voice change.   Respiratory:  Negative for shortness of breath and wheezing.   Cardiovascular:  Negative for chest pain, palpitations and leg swelling.  Gastrointestinal:  Negative for abdominal distention, abdominal pain, anal bleeding, blood in stool, constipation, diarrhea, nausea and vomiting.   Musculoskeletal:  Negative for arthralgias and myalgias.  Skin:  Negative for color change and pallor.  Neurological:  Negative for dizziness, syncope and weakness.  Psychiatric/Behavioral:  Negative for confusion. The patient is not nervous/anxious.   All other systems reviewed and are negative.    Medications Medications Ordered Prior to Encounter[1]  Pertinent medications related to GI and procedure were reviewed by me with the patient prior to the procedure  Current Medications[2]  sodium chloride  500 mL (11/24/24 0915)       Allergies[3] Allergies were reviewed by me prior to the procedure  Objective   Body mass index is 23.81 kg/m. Vitals:   11/24/24 0854  BP: (!) 152/91  Pulse: 70  Resp: 18  Temp: (!) 97.2 F (36.2 C)  TempSrc: Temporal  SpO2: 98%  Weight: 68.9 kg  Height: 5' 7 (1.702 m)     Physical Exam Vitals and nursing note reviewed.  Constitutional:      General: He is not in acute distress.    Appearance: Normal appearance. He is not ill-appearing, toxic-appearing or diaphoretic.  HENT:     Head: Normocephalic and atraumatic.     Nose: Nose normal.     Mouth/Throat:     Mouth: Mucous membranes are moist.     Pharynx: Oropharynx is clear.  Eyes:     General: No scleral icterus.    Extraocular Movements: Extraocular movements intact.  Cardiovascular:     Rate and Rhythm: Normal rate and regular rhythm.  Heart sounds: Normal heart sounds. No murmur heard.    No friction rub. No gallop.  Pulmonary:     Effort: Pulmonary effort is normal. No respiratory distress.     Breath sounds: Normal breath sounds. No wheezing, rhonchi or rales.  Abdominal:     General: Bowel sounds are normal. There is no distension.     Palpations: Abdomen is soft.     Tenderness: There is no abdominal tenderness. There is no guarding or rebound.  Musculoskeletal:     Cervical back: Neck supple.     Right lower leg: No edema.     Left lower leg: No edema.   Skin:    General: Skin is warm and dry.     Coloration: Skin is not jaundiced or pale.  Neurological:     General: No focal deficit present.     Mental Status: He is alert and oriented to person, place, and time. Mental status is at baseline.  Psychiatric:        Mood and Affect: Mood normal.        Behavior: Behavior normal.        Thought Content: Thought content normal.        Judgment: Judgment normal.      Assessment:  Mr. Marcus Kerr is a 78 y.o. male  who presents today for Colonoscopy for colorectal cancer screening .  Plan:  Colonoscopy with possible intervention today  Colonoscopy with possible biopsy, control of bleeding, polypectomy, and interventions as necessary has been discussed with the patient/patient representative. Informed consent was obtained from the patient/patient representative after explaining the indication, nature, and risks of the procedure including but not limited to death, bleeding, perforation, missed neoplasm/lesions, cardiorespiratory compromise, and reaction to medications. Opportunity for questions was given and appropriate answers were provided. Patient/patient representative has verbalized understanding is amenable to undergoing the procedure.   Elspeth Ozell Jungling, DO  [facility]  Portions of the record may have been created with voice recognition software. Occasional wrong-word or 'sound-a-like' substitutions may have occurred due to the inherent limitations of voice recognition software.  Read the chart carefully and recognize, using context, where substitutions may have occurred.    [1]  No current facility-administered medications on file prior to encounter.   Current Outpatient Medications on File Prior to Encounter  Medication Sig Dispense Refill   Ascorbic Acid (VITAMIN C PO) Take 1 tablet by mouth daily at 6 (six) AM.     gabapentin  (NEURONTIN ) 300 MG capsule Take 2 capsules (600 mg total) by mouth 2  (two) times daily. (Patient taking differently: Take 300 mg by mouth daily.) 120 capsule 8   tamsulosin  (FLOMAX ) 0.4 MG CAPS capsule Take 1 capsule (0.4 mg total) by mouth daily after supper. 30 capsule 11   VITAMIN A PO Take 1 tablet by mouth daily at 6 (six) AM.     VITAMIN D PO Take 1 tablet by mouth daily.     VITAMIN E PO Take 1 tablet by mouth daily at 6 (six) AM.     amLODipine (NORVASC) 10 MG tablet Take 10 mg by mouth every morning. (Patient not taking: Reported on 09/16/2024)     latanoprost (XALATAN) 0.005 % ophthalmic solution SMARTSIG:In Eye(s) (Patient not taking: Reported on 09/16/2024)    [2]  Current Facility-Administered Medications:    0.9 %  sodium chloride  infusion, , Intravenous, Continuous, Jungling Elspeth Ozell, DO, Last Rate: 20 mL/hr at 11/24/24 0915, 500 mL at 11/24/24 0915 [3]  No Known Allergies

## 2024-11-24 NOTE — Op Note (Signed)
 [facility] Gastroenterology Patient Name: Marcus Kerr Procedure Date: 11/24/2024 9:32 AM MRN: [id number] Account #: [id number] Date of Birth: [birth year] Admit Type: Outpatient Age: 78 Room: [facility] ENDO ROOM 2 Gender: Male Note Status: Finalized Instrument Name: Peds Colonoscope [id number] Procedure:             Colonoscopy Indications:           Screening for colorectal malignant neoplasm Providers:             Elspeth Ozell Jungling DO, DO Referring MD:          Sionne A. Zachary MD, MD (Referring MD) Medicines:             Monitored Anesthesia Care Complications:         No immediate complications. Estimated blood loss:                         Minimal. Procedure:             Pre-Anesthesia Assessment:                        - Prior to the procedure, a History and Physical was                         performed, and patient medications and allergies were                         reviewed. The patient is competent. The risks and                         benefits of the procedure and the sedation options and                         risks were discussed with the patient. All questions                         were answered and informed consent was obtained.                         Patient identification and proposed procedure were                         verified by the physician, the nurse, the anesthetist                         and the technician in the endoscopy suite. Mental                         Status Examination: alert and oriented. Airway                         Examination: normal oropharyngeal airway and neck                         mobility. Respiratory Examination: clear to                         auscultation. CV Examination: RRR, no murmurs, no S3  or S4. Prophylactic Antibiotics: The patient does not                         require prophylactic antibiotics. Prior                         Anticoagulants: The patient has taken no  anticoagulant                         or antiplatelet agents. ASA Grade Assessment: II - A                         patient with mild systemic disease. After reviewing                         the risks and benefits, the patient was deemed in                         satisfactory condition to undergo the procedure. The                         anesthesia plan was to use monitored anesthesia care                         (MAC). Immediately prior to administration of                         medications, the patient was re-assessed for adequacy                         to receive sedatives. The heart rate, respiratory                         rate, oxygen saturations, blood pressure, adequacy of                         pulmonary ventilation, and response to care were                         monitored throughout the procedure. The physical                         status of the patient was re-assessed after the                         procedure.                        After obtaining informed consent, the colonoscope was                         passed under direct vision. Throughout the procedure,                         the patient's blood pressure, pulse, and oxygen                         saturations were monitored continuously. The  Colonoscope was introduced through the anus and                         advanced to the the cecum, identified by appendiceal                         orifice and ileocecal valve. The colonoscopy was                         performed without difficulty. The patient tolerated                         the procedure well. The quality of the bowel                         preparation was evaluated using the BBPS [facility] Bowel                         Preparation Scale) with scores of: Right Colon = 3,                         Transverse Colon = 3 and Left Colon = 3 (entire mucosa                         seen well with no residual staining, small fragments                          of stool or opaque liquid). The total BBPS score                         equals 9. The ileocecal valve, appendiceal orifice,                         and rectum were photographed. Findings:      The perianal and digital rectal examinations were normal. Pertinent       negatives include normal sphincter tone.      Non-bleeding internal hemorrhoids were found during retroflexion. The       hemorrhoids were Grade I (internal hemorrhoids that do not prolapse).       Estimated blood loss: none.      Three sessile polyps were found in the descending colon, transverse       colon and cecum. The polyps were 1 to 2 mm in size. These polyps were       removed with a jumbo cold forceps. Resection and retrieval were       complete. Estimated blood loss was minimal.      A 4 to 5 mm polyp was found in the transverse colon. The polyp was       sessile. The polyp was removed with a cold snare. Resection and       retrieval were complete. Estimated blood loss was minimal.      The exam was otherwise without abnormality on direct and retroflexion       views. Impression:            - Non-bleeding internal hemorrhoids.                        - Three  1 to 2 mm polyps in the descending colon, in                         the transverse colon and in the cecum, removed with a                         jumbo cold forceps. Resected and retrieved.                        - One 4 to 5 mm polyp in the transverse colon, removed                         with a cold snare. Resected and retrieved.                        - The examination was otherwise normal on direct and                         retroflexion views. Recommendation:        - Patient has a contact number available for                         emergencies. The signs and symptoms of potential                         delayed complications were discussed with the patient.                         Return to normal activities tomorrow. Written                          discharge instructions were provided to the patient.                        - Discharge patient to home.                        - Resume previous diet.                        - Continue present medications.                        - No ibuprofen, naproxen, or other non-steroidal                         anti-inflammatory drugs for 5 days after polyp removal.                        - Await pathology results.                        - Repeat colonoscopy for surveillance based on                         pathology results.                        - Return to referring physician as previously  scheduled.                        - The findings and recommendations were discussed with                         the patient. Procedure Code(s):     --- Professional ---                        [id number], Colonoscopy, flexible; with removal of                         tumor(s), polyp(s), or other lesion(s) by snare                         technique                        45380, 59, Colonoscopy, flexible; with biopsy, single                         or multiple Diagnosis Code(s):     --- Professional ---                        Z12.11, Encounter for screening for malignant neoplasm                         of colon                        D12.4, Benign neoplasm of descending colon                        D12.3, Benign neoplasm of transverse colon (hepatic                         flexure or splenic flexure)                        D12.0, Benign neoplasm of cecum                        K64.0, First degree hemorrhoids CPT copyright 2022 [employer]. All rights reserved. The codes documented in this report are preliminary and upon coder review may  be revised to meet current compliance requirements. Attending Participation:      I personally performed the entire procedure. Elspeth Jungling, DO Elspeth Ozell Jungling DO, DO 11/24/2024 10:15:36 AM This report  has been signed electronically. Number of Addenda: 0 Note Initiated On: 11/24/2024 9:32 AM Scope Withdrawal Time: 0 hours 10 minutes 24 seconds  Total Procedure Duration: 0 hours 15 minutes 16 seconds  Estimated Blood Loss:  Estimated blood loss was minimal.      [facility]

## 2024-11-24 NOTE — Anesthesia Preprocedure Evaluation (Signed)
 Anesthesia Evaluation  Patient identified by MRN, date of birth, ID band Patient awake    Reviewed: Allergy & Precautions, NPO status , Patient's Chart, lab work & pertinent test results  Airway Mallampati: II  TM Distance: >3 FB Neck ROM: Full    Dental  (+) Caps   Pulmonary neg pulmonary ROS   Pulmonary exam normal        Cardiovascular Exercise Tolerance: Good hypertension, Pt. on medications + Peripheral Vascular Disease  negative cardio ROS Normal cardiovascular exam Rhythm:Regular Rate:Normal     Neuro/Psych negative neurological ROS  negative psych ROS   GI/Hepatic negative GI ROS, Neg liver ROS,,,  Endo/Other  negative endocrine ROS    Renal/GU negative Renal ROS  negative genitourinary   Musculoskeletal   Abdominal   Peds  Hematology negative hematology ROS (+)   Anesthesia Other Findings Past Medical History: No date: Aortic stenosis     Comment:  a).) stress echo 02/09/2015: EF 55%, mild LVH, mild               MR/TR, mod AR, mild AS; b.) TTE 04/13/2022: EF >55%, GLS               -19.7%, mild LAE/RVE, triv MR/TR/PR, mild AR, mod AS (MPG              23) No date: Atherosclerosis of both iliac arteries No date: Bilateral inguinal hernia (BIH)     Comment:  a.) s/p RIGHT repair 08/21/2017 No date: BPH (benign prostatic hyperplasia) No date: Cardiac murmur No date: HTN (hypertension) No date: Hyperlipidemia No date: Occasional use of marijuana  Past Surgical History: No date: COLONOSCOPY No date: HERNIA REPAIR 08/21/2017: INGUINAL HERNIA REPAIR; Right     Comment:  Procedure: HERNIA REPAIR INGUINAL ADULT;  Surgeon:               Claudene Larinda Bolder, MD;  Location: [facility] ORS;  Service:               General;  Laterality: Right; 01/24/2023: XI ROBOTIC ASSISTED INGUINAL HERNIA REPAIR WITH MESH; Left     Comment:  Procedure: XI ROBOTIC ASSISTED INGUINAL HERNIA REPAIR               WITH MESH;   Surgeon: Rodolph Romano, MD;                Location: [facility] ORS;  Service: General;  Laterality: Left;  BMI    Body Mass Index: 23.81 kg/m      Reproductive/Obstetrics negative OB ROS                              Anesthesia Physical Anesthesia Plan  ASA: 3  Anesthesia Plan: General   Post-op Pain Management:    Induction: Intravenous  PONV Risk Score and Plan: Propofol  infusion and TIVA  Airway Management Planned: Natural Airway and Nasal Cannula  Additional Equipment:   Intra-op Plan:   Post-operative Plan:   Informed Consent: I have reviewed the patients History and Physical, chart, labs and discussed the procedure including the risks, benefits and alternatives for the proposed anesthesia with the patient or authorized representative who has indicated his/her understanding and acceptance.     Dental Advisory Given  Plan Discussed with: CRNA  Anesthesia Plan Comments:         Anesthesia Quick Evaluation

## 2024-11-24 NOTE — Anesthesia Postprocedure Evaluation (Signed)
 Anesthesia Post Note  Patient: Marcus Kerr  Procedure(s) Performed: COLONOSCOPY POLYPECTOMY, INTESTINE  Patient location during evaluation: PACU Anesthesia Type: General Level of consciousness: awake Pain management: pain level controlled Vital Signs Assessment: post-procedure vital signs reviewed and stable Respiratory status: spontaneous breathing Cardiovascular status: stable Anesthetic complications: no   No notable events documented.   Last Vitals:  Vitals:   11/24/24 1016 11/24/24 1026  BP: 106/67 109/67  Pulse: 63 65  Resp: 16 15  Temp:    SpO2: 98% 100%    Last Pain:  Vitals:   11/24/24 1026  TempSrc:   PainSc: 0-No pain                 VAN STAVEREN,Demyah Smyre

## 2024-11-25 ENCOUNTER — Encounter: Payer: Self-pay | Admitting: Gastroenterology

## 2024-11-26 LAB — SURGICAL PATHOLOGY

## 2025-09-22 ENCOUNTER — Ambulatory Visit: Admitting: Urology
